# Patient Record
Sex: Female | Born: 1990 | Race: Black or African American | Hispanic: No | Marital: Married | Smoking: Never smoker
Health system: Southern US, Community
[De-identification: ages and names within clinical notes are randomized; demographics above are authoritative.]

## PROBLEM LIST (undated history)

## (undated) DIAGNOSIS — B1910 Unspecified viral hepatitis B without hepatic coma: Secondary | ICD-10-CM

## (undated) DIAGNOSIS — O24419 Gestational diabetes mellitus in pregnancy, unspecified control: Secondary | ICD-10-CM

## (undated) HISTORY — DX: Unspecified viral hepatitis B without hepatic coma: B19.10

## (undated) HISTORY — PX: NO PAST SURGERIES: SHX2092

---

## 2014-11-21 LAB — OB RESULTS CONSOLE RUBELLA ANTIBODY, IGM: RUBELLA: IMMUNE

## 2014-11-21 LAB — OB RESULTS CONSOLE ABO/RH: RH Type: POSITIVE

## 2014-11-21 LAB — OB RESULTS CONSOLE ANTIBODY SCREEN: Antibody Screen: NEGATIVE

## 2014-11-21 LAB — OB RESULTS CONSOLE HGB/HCT, BLOOD
HEMATOCRIT: 37 %
Hemoglobin: 12.3 g/dL

## 2014-11-21 LAB — OB RESULTS CONSOLE HEPATITIS B SURFACE ANTIGEN: Hepatitis B Surface Ag: POSITIVE

## 2014-11-21 LAB — OB RESULTS CONSOLE PLATELET COUNT: PLATELETS: 270 10*3/uL

## 2014-11-21 LAB — OB RESULTS CONSOLE RPR: RPR: NONREACTIVE

## 2014-11-27 LAB — OB RESULTS CONSOLE GC/CHLAMYDIA
Chlamydia: NEGATIVE
Gonorrhea: NEGATIVE

## 2015-11-22 LAB — OB RESULTS CONSOLE HGB/HCT, BLOOD
HEMATOCRIT: 37 %
Hemoglobin: 12.3 g/dL

## 2015-11-22 LAB — OB RESULTS CONSOLE GC/CHLAMYDIA: Gonorrhea: NEGATIVE

## 2015-11-22 LAB — OB RESULTS CONSOLE PLATELET COUNT: PLATELETS: 270 10*3/uL

## 2015-11-22 LAB — OB RESULTS CONSOLE RPR: RPR: NONREACTIVE

## 2015-11-28 LAB — DRUG SCREEN, URINE: Drug Screen, Urine: NEGATIVE

## 2015-11-28 LAB — OB RESULTS CONSOLE GC/CHLAMYDIA: Chlamydia: NEGATIVE

## 2015-12-02 LAB — HEPATITIS B DNA, ULTRAQUANTITATIVE, PCR: HEPATITIS B DNA: 588

## 2015-12-10 NOTE — L&D Delivery Note (Signed)
Delivery Note At 9:43 AM a viable and healthy vigorous female was delivered via Vaginal, Spontaneous Delivery (Presentation: Right Occiput Anterior).  APGAR: 9, 9; weight 9 lb 1.7 oz (4130 g).   Placenta status: Intact, Manual removal due to heavy bleeding immediately after delivery. Pitocin started <1 minute after delivery of baby. Bleeding decreased dramatically after manual removal and bimanual massage x 10 seconds. Fundus Firm. Cytotec 100 mcg given rectally. Cord: 3 vessels with the following complications: None.  Cord pH: NA.  Baby became apneic after 5 minute APGAR. NICU called. See note.   Anesthesia: Epidural,l Lidocaine  Episiotomy: None Lacerations: 2nd degree Suture Repair: 3.0 vicryl rapide Est. Blood Loss (mL): 800. Stage II PPH. Bleeding stable.   Mom to postpartum.  Baby to NICU. Mom's VSS, but will Type and cross two units of PRBCs and check Hgb due to low pre-delivery Hgb of 8.8.   Dorathy KinsmanSMITH, Kerah Hardebeck 05/24/2016, 10:36 AM

## 2015-12-22 LAB — AFP, QUAD SCREEN

## 2015-12-22 LAB — OB RESULTS CONSOLE HIV ANTIBODY (ROUTINE TESTING): HIV: NONREACTIVE

## 2016-03-13 ENCOUNTER — Encounter: Payer: Self-pay | Admitting: Advanced Practice Midwife

## 2016-03-13 ENCOUNTER — Ambulatory Visit (INDEPENDENT_AMBULATORY_CARE_PROVIDER_SITE_OTHER): Payer: Self-pay | Admitting: Advanced Practice Midwife

## 2016-03-13 VITALS — BP 97/63 | HR 86 | Temp 98.0°F | Ht 60.0 in | Wt 130.2 lb

## 2016-03-13 DIAGNOSIS — B181 Chronic viral hepatitis B without delta-agent: Secondary | ICD-10-CM | POA: Insufficient documentation

## 2016-03-13 DIAGNOSIS — Z603 Acculturation difficulty: Secondary | ICD-10-CM

## 2016-03-13 DIAGNOSIS — O280 Abnormal hematological finding on antenatal screening of mother: Secondary | ICD-10-CM | POA: Insufficient documentation

## 2016-03-13 DIAGNOSIS — B169 Acute hepatitis B without delta-agent and without hepatic coma: Secondary | ICD-10-CM

## 2016-03-13 DIAGNOSIS — O0993 Supervision of high risk pregnancy, unspecified, third trimester: Secondary | ICD-10-CM

## 2016-03-13 DIAGNOSIS — O285 Abnormal chromosomal and genetic finding on antenatal screening of mother: Secondary | ICD-10-CM

## 2016-03-13 DIAGNOSIS — O289 Unspecified abnormal findings on antenatal screening of mother: Secondary | ICD-10-CM

## 2016-03-13 LAB — CBC
HCT: 30.9 % — ABNORMAL LOW (ref 35.0–45.0)
Hemoglobin: 10 g/dL — ABNORMAL LOW (ref 11.7–15.5)
MCH: 27.7 pg (ref 27.0–33.0)
MCHC: 32.4 g/dL (ref 32.0–36.0)
MCV: 85.6 fL (ref 80.0–100.0)
MPV: 10.4 fL (ref 7.5–12.5)
Platelets: 202 10*3/uL (ref 140–400)
RBC: 3.61 MIL/uL — ABNORMAL LOW (ref 3.80–5.10)
RDW: 13.8 % (ref 11.0–15.0)
WBC: 7.3 10*3/uL (ref 3.8–10.8)

## 2016-03-13 LAB — POCT URINALYSIS DIP (DEVICE)
Bilirubin Urine: NEGATIVE
GLUCOSE, UA: NEGATIVE mg/dL
Hgb urine dipstick: NEGATIVE
Ketones, ur: NEGATIVE mg/dL
NITRITE: NEGATIVE
PH: 7 (ref 5.0–8.0)
PROTEIN: NEGATIVE mg/dL
Specific Gravity, Urine: 1.02 (ref 1.005–1.030)
UROBILINOGEN UA: 0.2 mg/dL (ref 0.0–1.0)

## 2016-03-13 MED ORDER — PANTOPRAZOLE SODIUM 20 MG PO TBEC
20.0000 mg | DELAYED_RELEASE_TABLET | Freq: Every day | ORAL | Status: DC
Start: 2016-03-13 — End: 2016-04-11

## 2016-03-13 NOTE — Progress Notes (Signed)
New OB Transfer Had prenatal care elsewhere See Smart Set  Subjective:    [name] is a G2P1001 6358w6d being seen today for her first obstetrical visit.  Her obstetrical history is significant for Hepatitis B, abnormal Quad Screen, Language barrier. Patient does intend to breast feed. Pregnancy history fully reviewed.  Patient reports no complaints. Except heartburn.  Filed Vitals:   03/13/16 0814 03/13/16 0821  BP: 97/63   Pulse: 86   Temp: 98 F (36.7 C)   Height:  5' (1.524 m)  Weight: 130 lb 3.2 oz (59.058 kg)     HISTORY: OB History  Gravida Para Term Preterm AB SAB TAB Ectopic Multiple Living  2 1 1       1     # Outcome Date GA Lbr Len/2nd Weight Sex Delivery Anes PTL Lv  2 Current           1 Term 06/20/04 672w0d  8 lb 5.3 oz (3.78 kg) M Vag-Spont   Y     Comments: vaccuum delivery, had + Hepatitis B blood test , [place]     Past Medical History  Diagnosis Date  . Hepatitis B infection     +test in first pregancy   History reviewed. No pertinent past surgical history. History reviewed. No pertinent family history.   Exam    Uterus:  Fundal Height: 28 cm  Pelvic Exam:    Perineum: Not examined due to done at other practice   Vulva: n/a   Vagina:  n/a   pH:    Cervix: n/a   Adnexa: not evaluated   Bony Pelvis: gynecoid  System: Breast:  normal appearance, no masses or tenderness   Skin: normal coloration and turgor, no rashes    Neurologic: oriented, grossly non-focal   Extremities: normal strength, tone, and muscle mass   HEENT neck supple with midline trachea   Mouth/Teeth mucous membranes moist, pharynx normal without lesions   Neck supple   Cardiovascular: regular rate and rhythm   Respiratory:  appears well, vitals normal, no respiratory distress, acyanotic, normal RR, ear and throat exam is normal, neck free of mass or lymphadenopathy, chest clear, no wheezing, crepitations, rhonchi, normal symmetric air entry   Abdomen: soft,  non-tender; bowel sounds normal; no masses,  no organomegaly   Urinary: n/a      Assessment:    Pregnancy: G2P1001 Patient Active Problem List   Diagnosis Date Noted  . Hepatitis B 03/13/2016  . Supervision of high risk pregnancy in third trimester 03/13/2016  . Language barrier, cultural differences 03/13/2016  . Abnormal quad screen 03/13/2016  . Multiple marker screen positive for Down syndrome 03/13/2016        Plan:     Initial labs drawn. Prenatal vitamins. Problem list reviewed and updated. Genetic Screening discussed Quad Screen: results reviewed.  Ultrasound discussed; fetal survey: ordered.  Follow up in 2 weeks. 50% of 30 min visit spent on counseling and coordination of care.   Used interpretor for visit Very difficult to communicate, even with interpretor Unclear if she had counseling for abnormal quad screen, will refer to MFM for US Will recheck Hep B titer today Rx Protonix sent    [facility],Keyon Winnick 03/13/2016

## 2016-03-13 NOTE — Progress Notes (Signed)
Here for first visit. Used [employer]  Interpreter#[id number].  Call disconnected. Used [employer] interpreter [id number]. Given prenatal education booklets.

## 2016-03-13 NOTE — Patient Instructions (Addendum)
Contraception Choices Contraception (birth control) is the use of any methods or devices to prevent pregnancy. Below are some methods to help avoid pregnancy. HORMONAL METHODS   Contraceptive implant. This is a thin, plastic tube containing progesterone hormone. It does not contain estrogen hormone. Your health care provider inserts the tube in the inner part of the upper arm. The tube can remain in place for up to 3 years. After 3 years, the implant must be removed. The implant prevents the ovaries from releasing an egg (ovulation), thickens the cervical mucus to prevent sperm from entering the uterus, and thins the lining of the inside of the uterus.  Progesterone-only injections. These injections are given every 3 months by your health care provider to prevent pregnancy. This synthetic progesterone hormone stops the ovaries from releasing eggs. It also thickens cervical mucus and changes the uterine lining. This makes it harder for sperm to survive in the uterus.  Birth control pills. These pills contain estrogen and progesterone hormone. They work by preventing the ovaries from releasing eggs (ovulation). They also cause the cervical mucus to thicken, preventing the sperm from entering the uterus. Birth control pills are prescribed by a health care provider.Birth control pills can also be used to treat heavy periods.  Minipill. This type of birth control pill contains only the progesterone hormone. They are taken every day of each month and must be prescribed by your health care provider.  Birth control patch. The patch contains hormones similar to those in birth control pills. It must be changed once a week and is prescribed by a health care provider.  Vaginal ring. The ring contains hormones similar to those in birth control pills. It is left in the vagina for 3 weeks, removed for 1 week, and then a new one is put back in place. The patient must be comfortable inserting and removing the ring  from the vagina.A health care provider's prescription is necessary.  Emergency contraception. Emergency contraceptives prevent pregnancy after unprotected sexual intercourse. This pill can be taken right after sex or up to 5 days after unprotected sex. It is most effective the sooner you take the pills after having sexual intercourse. Most emergency contraceptive pills are available without a prescription. Check with your pharmacist. Do not use emergency contraception as your only form of birth control. BARRIER METHODS   Female condom. This is a thin sheath (latex or rubber) that is worn over the penis during sexual intercourse. It can be used with spermicide to increase effectiveness.  Female condom. This is a soft, loose-fitting sheath that is put into the vagina before sexual intercourse.  Diaphragm. This is a soft, latex, dome-shaped barrier that must be fitted by a health care provider. It is inserted into the vagina, along with a spermicidal jelly. It is inserted before intercourse. The diaphragm should be left in the vagina for 6 to 8 hours after intercourse.  Cervical cap. This is a round, soft, latex or plastic cup that fits over the cervix and must be fitted by a health care provider. The cap can be left in place for up to 48 hours after intercourse.  Sponge. This is a soft, circular piece of polyurethane foam. The sponge has spermicide in it. It is inserted into the vagina after wetting it and before sexual intercourse.  Spermicides. These are chemicals that kill or block sperm from entering the cervix and uterus. They come in the form of creams, jellies, suppositories, foam, or tablets. They do not require a   prescription. They are inserted into the vagina with an applicator before having sexual intercourse. The process must be repeated every time you have sexual intercourse. INTRAUTERINE CONTRACEPTION  Intrauterine device (IUD). This is a T-shaped device that is put in a woman's uterus  during a menstrual period to prevent pregnancy. There are 2 types:  Copper IUD. This type of IUD is wrapped in copper wire and is placed inside the uterus. Copper makes the uterus and fallopian tubes produce a fluid that kills sperm. It can stay in place for 10 years.  Hormone IUD. This type of IUD contains the hormone progestin (synthetic progesterone). The hormone thickens the cervical mucus and prevents sperm from entering the uterus, and it also thins the uterine lining to prevent implantation of a fertilized egg. The hormone can weaken or kill the sperm that get into the uterus. It can stay in place for 3-5 years, depending on which type of IUD is used. PERMANENT METHODS OF CONTRACEPTION  Female tubal ligation. This is when the woman's fallopian tubes are surgically sealed, tied, or blocked to prevent the egg from traveling to the uterus.  Hysteroscopic sterilization. This involves placing a small coil or insert into each fallopian tube. Your doctor uses a technique called hysteroscopy to do the procedure. The device causes scar tissue to form. This results in permanent blockage of the fallopian tubes, so the sperm cannot fertilize the egg. It takes about 3 months after the procedure for the tubes to become blocked. You must use another form of birth control for these 3 months.  Female sterilization. This is when the female has the tubes that carry sperm tied off (vasectomy).This blocks sperm from entering the vagina during sexual intercourse. After the procedure, the man can still ejaculate fluid (semen). NATURAL PLANNING METHODS  Natural family planning. This is not having sexual intercourse or using a barrier method (condom, diaphragm, cervical cap) on days the woman could become pregnant.  Calendar method. This is keeping track of the length of each menstrual cycle and identifying when you are fertile.  Ovulation method. This is avoiding sexual intercourse during ovulation.  Symptothermal  method. This is avoiding sexual intercourse during ovulation, using a thermometer and ovulation symptoms.  Post-ovulation method. This is timing sexual intercourse after you have ovulated. Regardless of which type or method of contraception you choose, it is important that you use condoms to protect against the transmission of sexually transmitted infections (STIs). Talk with your health care provider about which form of contraception is most appropriate for you.   This information is not intended to replace advice given to you by your health care provider. Make sure you discuss any questions you have with your health care provider.   Document Released: 11/25/2005 Document Revised: 11/30/2013 Document Reviewed: 05/20/2013 Elsevier Interactive Patient Education [employer] Third Trimester of Pregnancy The third trimester is from week 29 through week 42, months 7 through 9. The third trimester is a time when the fetus is growing rapidly. At the end of the ninth month, the fetus is about 20 inches in length and weighs 6-10 pounds.  BODY CHANGES Your body goes through many changes during pregnancy. The changes vary from woman to woman.   Your weight will continue to increase. You can expect to gain 25-35 pounds (11-16 kg) by the end of the pregnancy.  You may begin to get stretch marks on your hips, abdomen, and breasts.  You may urinate more often because the fetus is moving lower  into your pelvis and pressing on your bladder.  You may develop or continue to have heartburn as a result of your pregnancy.  You may develop constipation because certain hormones are causing the muscles that push waste through your intestines to slow down.  You may develop hemorrhoids or swollen, bulging veins (varicose veins).  You may have pelvic pain because of the weight gain and pregnancy hormones relaxing your joints between the bones in your pelvis. Backaches may result from overexertion of the  muscles supporting your posture.  You may have changes in your hair. These can include thickening of your hair, rapid growth, and changes in texture. Some women also have hair loss during or after pregnancy, or hair that feels dry or thin. Your hair will most likely return to normal after your baby is born.  Your breasts will continue to grow and be tender. A yellow discharge may leak from your breasts called colostrum.  Your belly button may stick out.  You may feel short of breath because of your expanding uterus.  You may notice the fetus "dropping," or moving lower in your abdomen.  You may have a bloody mucus discharge. This usually occurs a few days to a week before labor begins.  Your cervix becomes thin and soft (effaced) near your due date. WHAT TO EXPECT AT YOUR PRENATAL EXAMS  You will have prenatal exams every 2 weeks until week 36. Then, you will have weekly prenatal exams. During a routine prenatal visit:  You will be weighed to make sure you and the fetus are growing normally.  Your blood pressure is taken.  Your abdomen will be measured to track your baby's growth.  The fetal heartbeat will be listened to.  Any test results from the previous visit will be discussed.  You may have a cervical check near your due date to see if you have effaced. At around 36 weeks, your caregiver will check your cervix. At the same time, your caregiver will also perform a test on the secretions of the vaginal tissue. This test is to determine if a type of bacteria, Group B streptococcus, is present. Your caregiver will explain this further. Your caregiver may ask you:  What your birth plan is.  How you are feeling.  If you are feeling the baby move.  If you have had any abnormal symptoms, such as leaking fluid, bleeding, severe headaches, or abdominal cramping.  If you are using any tobacco products, including cigarettes, chewing tobacco, and electronic cigarettes.  If you have  any questions. Other tests or screenings that may be performed during your third trimester include:  Blood tests that check for low iron levels (anemia).  Fetal testing to check the health, activity level, and growth of the fetus. Testing is done if you have certain medical conditions or if there are problems during the pregnancy.  HIV (human immunodeficiency virus) testing. If you are at high risk, you may be screened for HIV during your third trimester of pregnancy. FALSE LABOR You may feel small, irregular contractions that eventually go away. These are called Braxton Hicks contractions, or false labor. Contractions may last for hours, days, or even weeks before true labor sets in. If contractions come at regular intervals, intensify, or become painful, it is best to be seen by your caregiver.  SIGNS OF LABOR   Menstrual-like cramps.  Contractions that are 5 minutes apart or less.  Contractions that start on the top of the uterus and spread down to  the lower abdomen and back.  A sense of increased pelvic pressure or back pain.  A watery or bloody mucus discharge that comes from the vagina. If you have any of these signs before the 37th week of pregnancy, call your caregiver right away. You need to go to the hospital to get checked immediately. HOME CARE INSTRUCTIONS   Avoid all smoking, herbs, alcohol, and unprescribed drugs. These chemicals affect the formation and growth of the baby.  Do not use any tobacco products, including cigarettes, chewing tobacco, and electronic cigarettes. If you need help quitting, ask your health care provider. You may receive counseling support and other resources to help you quit.  Follow your caregiver's instructions regarding medicine use. There are medicines that are either safe or unsafe to take during pregnancy.  Exercise only as directed by your caregiver. Experiencing uterine cramps is a good sign to stop exercising.  Continue to eat regular,  healthy meals.  Wear a good support bra for breast tenderness.  Do not use hot tubs, steam rooms, or saunas.  Wear your seat belt at all times when driving.  Avoid raw meat, uncooked cheese, cat litter boxes, and soil used by cats. These carry germs that can cause birth defects in the baby.  Take your prenatal vitamins.  Take 1500-2000 mg of calcium daily starting at the 20th week of pregnancy until you deliver your baby.  Try taking a stool softener (if your caregiver approves) if you develop constipation. Eat more high-fiber foods, such as fresh vegetables or fruit and whole grains. Drink plenty of fluids to keep your urine clear or pale yellow.  Take warm sitz baths to soothe any pain or discomfort caused by hemorrhoids. Use hemorrhoid cream if your caregiver approves.  If you develop varicose veins, wear support hose. Elevate your feet for 15 minutes, 3-4 times a day. Limit salt in your diet.  Avoid heavy lifting, wear low heal shoes, and practice good posture.  Rest a lot with your legs elevated if you have leg cramps or low back pain.  Visit your dentist if you have not gone during your pregnancy. Use a soft toothbrush to brush your teeth and be gentle when you floss.  A sexual relationship may be continued unless your caregiver directs you otherwise.  Do not travel far distances unless it is absolutely necessary and only with the approval of your caregiver.  Take prenatal classes to understand, practice, and ask questions about the labor and delivery.  Make a trial run to the hospital.  Pack your hospital bag.  Prepare the baby's nursery.  Continue to go to all your prenatal visits as directed by your caregiver. SEEK MEDICAL CARE IF:  You are unsure if you are in labor or if your water has broken.  You have dizziness.  You have mild pelvic cramps, pelvic pressure, or nagging pain in your abdominal area.  You have persistent nausea, vomiting, or diarrhea.  You  have a bad smelling vaginal discharge.  You have pain with urination. SEEK IMMEDIATE MEDICAL CARE IF:   You have a fever.  You are leaking fluid from your vagina.  You have spotting or bleeding from your vagina.  You have severe abdominal cramping or pain.  You have rapid weight loss or gain.  You have shortness of breath with chest pain.  You notice sudden or extreme swelling of your face, hands, ankles, feet, or legs.  You have not felt your baby move in over an hour.  You have severe headaches that do not go away with medicine.  You have vision changes.   This information is not intended to replace advice given to you by your health care provider. Make sure you discuss any questions you have with your health care provider.   Document Released: 11/19/2001 Document Revised: 12/16/2014 Document Reviewed: 01/26/2013 Elsevier Interactive Patient Education 2016 [employer] Levonorgestrel intrauterine device (IUD) What is this medicine? LEVONORGESTREL IUD (LEE voe nor jes trel) is a contraceptive (birth control) device. The device is placed inside the uterus by a healthcare professional. It is used to prevent pregnancy and can also be used to treat heavy bleeding that occurs during your period. Depending on the device, it can be used for 3 to 5 years. This medicine may be used for other purposes; ask your health care provider or pharmacist if you have questions. What should I tell my health care provider before I take this medicine? They need to know if you have any of these conditions: -abnormal Pap smear -cancer of the breast, uterus, or cervix -diabetes -endometritis -genital or pelvic infection now or in the past -have more than one sexual partner or your partner has more than one partner -heart disease -history of an ectopic or tubal pregnancy -immune system problems -IUD in place -liver disease or tumor -problems with blood clots or take blood-thinners -use  intravenous drugs -uterus of unusual shape -vaginal bleeding that has not been explained -an unusual or allergic reaction to levonorgestrel, other hormones, silicone, or polyethylene, medicines, foods, dyes, or preservatives -pregnant or trying to get pregnant -breast-feeding How should I use this medicine? This device is placed inside the uterus by a health care professional. Talk to your pediatrician regarding the use of this medicine in children. Special care may be needed. Overdosage: If you think you have taken too much of this medicine contact a poison control center or emergency room at once. NOTE: This medicine is only for you. Do not share this medicine with others. What if I miss a dose? This does not apply. What may interact with this medicine? Do not take this medicine with any of the following medications: -amprenavir -bosentan -fosamprenavir This medicine may also interact with the following medications: -aprepitant -barbiturate medicines for inducing sleep or treating seizures -bexarotene -griseofulvin -medicines to treat seizures like carbamazepine, ethotoin, felbamate, oxcarbazepine, phenytoin, topiramate -modafinil -pioglitazone -rifabutin -rifampin -rifapentine -some medicines to treat HIV infection like atazanavir, indinavir, lopinavir, nelfinavir, tipranavir, ritonavir -St. John's wort -warfarin This list may not describe all possible interactions. Give your health care provider a list of all the medicines, herbs, non-prescription drugs, or dietary supplements you use. Also tell them if you smoke, drink alcohol, or use illegal drugs. Some items may interact with your medicine. What should I watch for while using this medicine? Visit your doctor or health care professional for regular check ups. See your doctor if you or your partner has sexual contact with others, becomes HIV positive, or gets a sexual transmitted disease. This product does not protect you  against HIV infection (AIDS) or other sexually transmitted diseases. You can check the placement of the IUD yourself by reaching up to the top of your vagina with clean fingers to feel the threads. Do not pull on the threads. It is a good habit to check placement after each menstrual period. Call your doctor right away if you feel more of the IUD than just the threads or if you cannot feel the threads at all. The  IUD may come out by itself. You may become pregnant if the device comes out. If you notice that the IUD has come out use a backup birth control method like condoms and call your health care provider. Using tampons will not change the position of the IUD and are okay to use during your period. What side effects may I notice from receiving this medicine? Side effects that you should report to your doctor or health care professional as soon as possible: -allergic reactions like skin rash, itching or hives, swelling of the face, lips, or tongue -fever, flu-like symptoms -genital sores -high blood pressure -no menstrual period for 6 weeks during use -pain, swelling, warmth in the leg -pelvic pain or tenderness -severe or sudden headache -signs of pregnancy -stomach cramping -sudden shortness of breath -trouble with balance, talking, or walking -unusual vaginal bleeding, discharge -yellowing of the eyes or skin Side effects that usually do not require medical attention (report to your doctor or health care professional if they continue or are bothersome): -acne -breast pain -change in sex drive or performance -changes in weight -cramping, dizziness, or faintness while the device is being inserted -headache -irregular menstrual bleeding within first 3 to 6 months of use -nausea This list may not describe all possible side effects. Call your doctor for medical advice about side effects. You may report side effects to FDA at 1-800-FDA-1088. Where should I keep my medicine? This does  not apply. NOTE: This sheet is a summary. It may not cover all possible information. If you have questions about this medicine, talk to your doctor, pharmacist, or health care provider.    2016, Elsevier/[employer] Standard. (2011-12-26 13:54:04)

## 2016-03-14 LAB — HEPATITIS B DNA, ULTRAQUANTITATIVE, PCR
Hepatitis B DNA (Calc): 2.33 Log IU/mL — ABNORMAL HIGH (ref <1.30)
Hepatitis B DNA: 216 IU/mL — ABNORMAL HIGH (ref <20)

## 2016-03-14 LAB — GLUCOSE TOLERANCE, 1 HOUR (50G) W/O FASTING: GLUCOSE, 1 HR, GESTATIONAL: 136 mg/dL (ref <140)

## 2016-03-14 LAB — HIV ANTIBODY (ROUTINE TESTING W REFLEX): HIV: NONREACTIVE

## 2016-03-14 LAB — RPR

## 2016-03-16 ENCOUNTER — Encounter: Payer: Self-pay | Admitting: *Deleted

## 2016-03-16 ENCOUNTER — Encounter: Payer: Self-pay | Admitting: Advanced Practice Midwife

## 2016-03-16 DIAGNOSIS — O24419 Gestational diabetes mellitus in pregnancy, unspecified control: Secondary | ICD-10-CM | POA: Insufficient documentation

## 2016-03-18 ENCOUNTER — Telehealth: Payer: Self-pay | Admitting: General Practice

## 2016-03-18 DIAGNOSIS — O280 Abnormal hematological finding on antenatal screening of mother: Secondary | ICD-10-CM

## 2016-03-18 NOTE — Telephone Encounter (Addendum)
[facility]. Patient's 1 hr gtt was elevated and needs 3 hr. Placed referral to MFM. MFM will call back to set up genetic counseling as the order needs an addendum from MFM

## 2016-03-20 NOTE — Telephone Encounter (Signed)
Called patient with interpreter [phone] and the phone number was invalid. Will discuss results & need for appt with patient at next appt. Made note in appt info.

## 2016-03-22 ENCOUNTER — Ambulatory Visit (HOSPITAL_COMMUNITY): Payer: Self-pay

## 2016-03-27 ENCOUNTER — Encounter: Payer: Self-pay | Admitting: Advanced Practice Midwife

## 2016-03-28 ENCOUNTER — Ambulatory Visit (INDEPENDENT_AMBULATORY_CARE_PROVIDER_SITE_OTHER): Payer: Self-pay | Admitting: Obstetrics & Gynecology

## 2016-03-28 VITALS — BP 106/67 | HR 95 | Wt 131.0 lb

## 2016-03-28 DIAGNOSIS — Z3493 Encounter for supervision of normal pregnancy, unspecified, third trimester: Secondary | ICD-10-CM

## 2016-03-28 DIAGNOSIS — O99810 Abnormal glucose complicating pregnancy: Secondary | ICD-10-CM

## 2016-03-28 DIAGNOSIS — K219 Gastro-esophageal reflux disease without esophagitis: Secondary | ICD-10-CM

## 2016-03-28 DIAGNOSIS — O219 Vomiting of pregnancy, unspecified: Secondary | ICD-10-CM

## 2016-03-28 DIAGNOSIS — R8290 Unspecified abnormal findings in urine: Secondary | ICD-10-CM

## 2016-03-28 DIAGNOSIS — Z3483 Encounter for supervision of other normal pregnancy, third trimester: Secondary | ICD-10-CM

## 2016-03-28 DIAGNOSIS — O99613 Diseases of the digestive system complicating pregnancy, third trimester: Secondary | ICD-10-CM

## 2016-03-28 LAB — POCT URINALYSIS DIP (DEVICE)
BILIRUBIN URINE: NEGATIVE
Glucose, UA: NEGATIVE mg/dL
HGB URINE DIPSTICK: NEGATIVE
KETONES UR: NEGATIVE mg/dL
NITRITE: POSITIVE — AB
PH: 6 (ref 5.0–8.0)
PROTEIN: NEGATIVE mg/dL
Specific Gravity, Urine: 1.015 (ref 1.005–1.030)
Urobilinogen, UA: 0.2 mg/dL (ref 0.0–1.0)

## 2016-03-28 MED ORDER — FAMOTIDINE 20 MG PO TABS: 20.0000 mg | ORAL_TABLET | Freq: Two times a day (BID) | ORAL | Status: DC

## 2016-03-28 MED ORDER — NITROFURANTOIN MONOHYD MACRO 100 MG PO CAPS: 100.0000 mg | ORAL_CAPSULE | Freq: Two times a day (BID) | ORAL | Status: DC

## 2016-03-28 MED ORDER — METOCLOPRAMIDE HCL 10 MG PO TABS: 10.0000 mg | ORAL_TABLET | Freq: Four times a day (QID) | ORAL | Status: DC | PRN

## 2016-03-28 NOTE — Progress Notes (Signed)
  Subjective:  [name] is a 25 y.o. G2P1001 at 3264w0d being seen today for ongoing prenatal care.  She is currently monitored for the following issues for this low-risk pregnancy and has Hepatitis B; Supervision of normal pregnancy in third trimester; Language barrier, cultural differences; Abnormal quad screen; and Abnormal maternal glucose tolerance, antepartum on her problem list.  Amharic interpreter used  Patient reports nausea/vomiting past 2 days following meals; patient reports tingling in her legs at night; also reports pelvic pain. Also reports frequent urination.   Contractions: Not present. Vag. Bleeding: None.  Movement: Present. Denies leaking of fluid.   The following portions of the patient's history were reviewed and updated as appropriate: allergies, current medications, past family history, past medical history, past social history, past surgical history and problem list. Problem list updated.  Objective:   Filed Vitals:   03/28/16 0914  BP: 106/67  Pulse: 95  Weight: 131 lb (59.421 kg)    Fetal Status: Fetal Heart Rate (bpm): 137 Fundal Height: 31 cm Movement: Present     General:  Alert, oriented and cooperative. Patient is in no acute distress.  Skin: Skin is warm and dry. No rash noted.   Cardiovascular: Normal heart rate noted  Respiratory: Normal respiratory effort, no problems with respiration noted  Abdomen: Soft, gravid, appropriate for gestational age. Pain/Pressure: Present     Pelvic: Vag. Bleeding: None Vag D/C Character: White  Cervical exam deferred        Extremities: Normal range of motion.  Edema: None  Mental Status: Normal mood and affect. Normal behavior. Normal judgment and thought content.   Urinalysis: Urine Protein: Negative Urine Glucose: Negative.  Positive nitrites and mod leuks on UA. Assessment and Plan:  Pregnancy: G2P1001 at 6964w0d  1. Abnormal urinalysis Positive nitrites and mod leuks on UA. Likely UTI. Macrobid  presumptively ordered. Will follow up cultures - Culture, OB Urine - nitrofurantoin, macrocrystal-monohydrate, (MACROBID) 100 MG capsule; Take 1 capsule (100 mg total) by mouth 2 (two) times daily.  Dispense: 14 capsule; Refill: 1  2. Gastroesophageal reflux during pregnancy in third trimester, antepartum - famotidine (PEPCID) 20 MG tablet; Take 1 tablet (20 mg total) by mouth 2 (two) times daily.  Dispense: 60 tablet; Refill: 3  3. Nausea and vomiting during pregnancy - metoCLOPramide (REGLAN) 10 MG tablet; Take 1 tablet (10 mg total) by mouth 4 (four) times daily as needed for nausea or vomiting.  Dispense: 30 tablet; Refill: 2  4. Supervision of normal pregnancy in third trimester 3 hr GTT today for 1 hr GTT of 136; will follow up results and manage accordingly.  Preterm labor symptoms and general obstetric precautions including but not limited to vaginal bleeding, contractions, leaking of fluid and fetal movement were reviewed in detail with the patient. Please refer to After Visit Summary for other counseling recommendations.  Return in about 2 weeks (around 04/11/2016) for OB Visit.   Tereso NewcomerUgonna A Caliber Landess, MD

## 2016-03-28 NOTE — Patient Instructions (Signed)
Return to clinic for any scheduled appointments or obstetric concerns, or go to [facility] for evaluation   AREA PEDIATRIC/FAMILY PRACTICE PHYSICIANS  [facility] [address] [place], [place] [address] Phone - [phone]   Fax - [phone]  [facility] [address] [place], [place]  [address] Phone - [phone]   Fax - [phone]  [facility] [address] [place], [place]  [address] Phone - [phone]   Fax - [phone]  [facility] [address] [place], [place]  [address] Phone - [phone]   Fax - [phone]  [facility] [address] [place], [place]  [address] Phone - [phone]   Fax - [phone]  [facility] [address] [place], [place]  [address] Phone - [phone]   Fax - [phone]  [facility] [address] [place], [place]  [address] Phone - [phone]   Fax - [phone]  [facility] [address] [place], [place]  [address] Phone - [phone]   Fax - [phone]  [facility] [address] [place], [place]  [address] Phone - [phone]   Fax - [phone] [facility] [address] [place], [place]  [address] Phone - [phone]   Fax - [phone]  [facility] [address] [place], [place]  [address] Phone - [phone]   Fax - [phone]  [facility] [address] [place], [place]  [address] Phone - [phone]   Fax - [phone]  [facility] [address] [place], [place]  [address] Phone - [phone]   Fax - [phone]  [facility] GOSR[facility] [address] [place], [place]  [address] Phone - [phone]  [facility] [address] [place], [place]  [address] Phone - [phone]   Fax - [phone]  [facility] [address] [place], [place]  [address] Phone - [phone]   Fax - [phone]  [facility] [address] [place], [place]  [address] Phone - [phone]   Fax - [phone]  [facility] [address] [place], [place]  [address] Phone - [phone]   Fax - [phone]   [facility] [address] [place], [place]  [address] Phone - [phone]   Fax - [phone]  [facility] [address] [place], [place]  [address] Phone - [phone]   Fax - [phone]  [facility] [address] [place], [place]  [address] Phone - [phone]   Fax - [phone]  [facility] [address] , [place]  [address] Phone - [phone]   Fax - [phone]  [facility] [address] , [place]  [address] Phone - [phone]   Fax - [phone] [facility] [address] [place], [place]  [address] Phone - [phone]   Fax - [phone]  [facility] [address] [place], [place]  [address] Phone - [phone]   Fax - [phone]  [facility] [address] [place],   [address] Phone - [phone]   Fax - [phone]    

## 2016-03-29 ENCOUNTER — Encounter: Payer: Self-pay | Admitting: Obstetrics & Gynecology

## 2016-03-29 LAB — GLUCOSE TOLERANCE, 3 HOURS
GLUCOSE, 1 HOUR-GESTATIONAL: 200 mg/dL — AB (ref <190)
GLUCOSE, 2 HOUR-GESTATIONAL: 166 mg/dL — AB (ref <165)
Glucose Tolerance, Fasting: 94 mg/dL (ref 65–104)
Glucose, GTT - 3 Hour: 147 mg/dL — ABNORMAL HIGH (ref <145)

## 2016-03-29 LAB — CULTURE, OB URINE
Colony Count: NO GROWTH
Organism ID, Bacteria: NO GROWTH

## 2016-04-10 ENCOUNTER — Telehealth: Payer: Self-pay | Admitting: *Deleted

## 2016-04-10 NOTE — Telephone Encounter (Signed)
Per message from Dr. Macon LargeAnyanwu need to tell patient she has GDM and needs GDM education .  Attempted to call [place] with [employer] [phone] but patient does not have number-' Will make a note on patient appointment to discuss with her- she  is being seen in the am for obfu

## 2016-04-11 ENCOUNTER — Ambulatory Visit (INDEPENDENT_AMBULATORY_CARE_PROVIDER_SITE_OTHER): Payer: Self-pay | Admitting: Obstetrics & Gynecology

## 2016-04-11 VITALS — BP 103/65 | HR 78 | Wt 140.3 lb

## 2016-04-11 DIAGNOSIS — O0993 Supervision of high risk pregnancy, unspecified, third trimester: Secondary | ICD-10-CM

## 2016-04-11 LAB — POCT URINALYSIS DIP (DEVICE)
Bilirubin Urine: NEGATIVE
GLUCOSE, UA: NEGATIVE mg/dL
Hgb urine dipstick: NEGATIVE
KETONES UR: NEGATIVE mg/dL
NITRITE: NEGATIVE
PH: 6.5 (ref 5.0–8.0)
PROTEIN: NEGATIVE mg/dL
Specific Gravity, Urine: 1.01 (ref 1.005–1.030)
UROBILINOGEN UA: 0.2 mg/dL (ref 0.0–1.0)

## 2016-04-11 MED ORDER — PANTOPRAZOLE SODIUM 40 MG PO TBEC: 40.0000 mg | DELAYED_RELEASE_TABLET | Freq: Every day | ORAL | Status: DC

## 2016-04-11 NOTE — Telephone Encounter (Signed)
Patient did come to ob appt and was told she has GDM- made appt for 04/15/16 to meet with diabetic educator.

## 2016-04-11 NOTE — Progress Notes (Signed)
Nutrition Note: GDM diet education Pt has gained  17.3# @ 6560w0d, which is < expected. Pt reports eating 3 meals and 3 snacks daily. Pt is taking PNV. Pt reports no N & V or heartburn. NKFA. Pt received verbal and written education on GDM diet. Discussed weight gain goals of 25-35 # Pt plans to BF Pt does not have WIC but plans to apply.  F/u in 3-4 weeks. Carloyn Mannerebekah Fredrick [place] MS RD LDN

## 2016-04-11 NOTE — Progress Notes (Signed)
Subjective:  [name] is a 25 y.o. G2P1001 at 3744w0d being seen today for ongoing prenatal care.  She is currently monitored for the following issues for this high-risk pregnancy and has Hepatitis B; Supervision of high risk pregnancy in third trimester; Language barrier, cultural differences; Abnormal quad screen; and Gestational diabetes mellitus, antepartum on her problem list.  Patient reports GERD in spite of medical management with famotidine.  Contractions: Not present. Vag. Bleeding: None.  Movement: Present. Denies leaking of fluid.   The following portions of the patient's history were reviewed and updated as appropriate: allergies, current medications, past family history, past medical history, past social history, past surgical history and problem list. Problem list updated.  Objective:   Filed Vitals:   04/11/16 1027 04/11/16 1055  BP: 103/65 103/65  Pulse: 78 78  Weight: 140 lb 4.8 oz (63.64 kg)     Fetal Status: Fetal Heart Rate (bpm): 159   Movement: Present     General:  Alert, oriented and cooperative. Patient is in no acute distress.  Skin: Skin is warm and dry. No rash noted.   Cardiovascular: Normal heart rate noted  Respiratory: Normal respiratory effort, no problems with respiration noted  Abdomen: Soft, gravid, appropriate for gestational age. Pain/Pressure: Present     Pelvic: Vag. Bleeding: None Vag D/C Character: White   Cervical exam deferred        Extremities: Normal range of motion.  Edema: None  Mental Status: Normal mood and affect. Normal behavior. Normal judgment and thought content.   Urinalysis: Urine Protein: Negative Urine Glucose: Negative  Assessment and Plan:  Pregnancy: G2P1001 at 5844w0d  1. Supervision of high risk pregnancy in third trimester Today she was diagnosed with GDM but was unable to meet with the diabetes counselor regarding blood sugar checks. Will plan for her to return on Monday 5/8 for GDM education.   Patient  complains of continued GERD in spite of prior medical management with famotidine, also complains that medication makes her more fatigued. Will prescribe Protonix for daily use.  - pantoprazole (PROTONIX) 40 MG tablet; Take 1 tablet (40 mg total) by mouth daily.  Dispense: 30 tablet; Refill: 3  Preterm labor symptoms and general obstetric precautions including but not limited to vaginal bleeding, contractions, leaking of fluid and fetal movement were reviewed in detail with the patient. Please refer to After Visit Summary for other counseling recommendations.  Return in about 4 days (around 04/15/2016).   Hessie Knowshomas C Chalese Peach, Med Student

## 2016-04-11 NOTE — Patient Instructions (Signed)
Gestational Diabetes Mellitus  Gestational diabetes mellitus, often simply referred to as gestational diabetes, is a type of diabetes that some women develop during pregnancy. In gestational diabetes, the pancreas does not make enough insulin (a hormone), the cells are less responsive to the insulin that is made (insulin resistance), or both. Normally, insulin moves sugars from food into the tissue cells. The tissue cells use the sugars for energy. The lack of insulin or the lack of normal response to insulin causes excess sugars to build up in the blood instead of going into the tissue cells. As a result, high blood sugar (hyperglycemia) develops. The effect of high sugar (glucose) levels can cause many problems.   RISK FACTORS  You have an increased chance of developing gestational diabetes if you have a family history of diabetes and also have one or more of the following risk factors:  · A body mass index over 30 (obesity).  · A previous pregnancy with gestational diabetes.  · An older age at the time of pregnancy.  If blood glucose levels are kept in the normal range during pregnancy, women can have a healthy pregnancy. If your blood glucose levels are not well controlled, there may be risks to you, your unborn baby (fetus), your labor and delivery, or your newborn baby.   SYMPTOMS   If symptoms are experienced, they are much like symptoms you would normally expect during pregnancy. The symptoms of gestational diabetes include:   · Increased thirst (polydipsia).  · Increased urination (polyuria).  · Increased urination during the night (nocturia).  · Weight loss. This weight loss may be rapid.  · Frequent, recurring infections.  · Tiredness (fatigue).  · Weakness.  · Vision changes, such as blurred vision.  · Fruity smell to your breath.  · Abdominal pain.  DIAGNOSIS  Diabetes is diagnosed when blood glucose levels are increased. Your blood glucose level may be checked by one or more of the following blood  tests:  · A fasting blood glucose test. You will not be allowed to eat for at least 8 hours before a blood sample is taken.  · A random blood glucose test. Your blood glucose is checked at any time of the day regardless of when you ate.  · An oral glucose tolerance test (OGTT). Your blood glucose is measured after you have not eaten (fasted) for 1-3 hours and then after you drink a glucose-containing beverage. Since the hormones that cause insulin resistance are highest at about 24-28 weeks of a pregnancy, an OGTT is usually performed during that time. If you have risk factors, you may be screened for undiagnosed type 2 diabetes at your first prenatal visit.  TREATMENT   Gestational diabetes should be managed first with diet and exercise. Medicines may be added only if they are needed.  · You will need to take diabetes medicine or insulin daily to keep blood glucose levels in the desired range.  · You will need to match insulin dosing with exercise and healthy food choices.  If you have gestational diabetes, your treatment goal is to maintain the following blood glucose levels:  · Before meals (preprandial): at or below 95 mg/dL.  · After meals (postprandial):    One hour after a meal: at or below 140 mg/dL.    Two hours after a meal: at or below 120 mg/dL.  If you have pre-existing type 1 or type 2 diabetes, your treatment goal is to maintain the following blood glucose levels:  · Before   meals, at bedtime, and overnight: 60-99 mg/dL.  · After meals: peak of 100-129 mg/dL.  HOME CARE INSTRUCTIONS   · Have your hemoglobin A1c level checked twice a year.  · Perform daily blood glucose monitoring as directed by your health care provider. It is common to perform frequent blood glucose monitoring.  · Monitor urine ketones when you are ill and as directed by your health care provider.  · Take your diabetes medicine and insulin as directed by your health care provider to maintain your blood glucose level in the desired  range.  ¨ Never run out of diabetes medicine or insulin. It is needed every day.  ¨ Adjust insulin based on your intake of carbohydrates. Carbohydrates can raise blood glucose levels but need to be included in your diet. Carbohydrates provide vitamins, minerals, and fiber which are an essential part of a healthy diet. Carbohydrates are found in fruits, vegetables, whole grains, dairy products, legumes, and foods containing added sugars.  · Eat healthy foods. Alternate 3 meals with 3 snacks.  · Maintain a healthy weight gain. The usual total expected weight gain varies according to your prepregnancy body mass index (BMI).  · Carry a medical alert card or wear your medical alert jewelry.  · Carry a 15-gram carbohydrate snack with you at all times to treat low blood glucose (hypoglycemia). Some examples of 15-gram carbohydrate snacks include:  ¨ Glucose tablets, 3 or 4.  ¨ Glucose gel, 15-gram tube.  ¨ Raisins, 2 tablespoons (24 g).  ¨ Jelly beans, 6.  ¨ Animal crackers, 8.  ¨ Fruit juice, regular soda, or low-fat milk, 4 ounces (120 mL).  ¨ Gummy treats, 9.  · Recognize hypoglycemia. Hypoglycemia during pregnancy occurs with blood glucose levels of 60 mg/dL and below. The risk for hypoglycemia increases when fasting or skipping meals, during or after intense exercise, and during sleep. Hypoglycemia symptoms can include:  ¨ Tremors or shakes.  ¨ Decreased ability to concentrate.  ¨ Sweating.  ¨ Increased heart rate.  ¨ Headache.  ¨ Dry mouth.  ¨ Hunger.  ¨ Irritability.  ¨ Anxiety.  ¨ Restless sleep.  ¨ Altered speech or coordination.  ¨ Confusion.  · Treat hypoglycemia promptly. If you are alert and able to safely swallow, follow the 15:15 rule:  ¨ Take 15-20 grams of rapid-acting glucose or carbohydrate. Rapid-acting options include glucose gel, glucose tablets, or 4 ounces (120 mL) of fruit juice, regular soda, or low-fat milk.  ¨ Check your blood glucose level 15 minutes after taking the glucose.  ¨ Take 15-20  grams more of glucose if the repeat blood glucose level is still 70 mg/dL or below.  ¨ Eat a meal or snack within 1 hour once blood glucose levels return to normal.  · Be alert to polyuria (excess urination) and polydipsia (excess thirst) which are early signs of hyperglycemia. An early awareness of hyperglycemia allows for prompt treatment. Treat hyperglycemia as directed by your health care provider.  · Engage in at least 30 minutes of physical activity a day or as directed by your health care provider. Ten minutes of physical activity timed 30 minutes after each meal is encouraged to control postprandial blood glucose levels.  · Adjust your insulin dosing and food intake as needed if you start a new exercise or sport.  · Follow your sick-day plan at any time you are unable to eat or drink as usual.  · Avoid tobacco and alcohol use.  · Keep all follow-up visits as directed   by your health care provider.  · Follow the advice of your health care provider regarding your prenatal and post-delivery (postpartum) appointments, meal planning, exercise, medicines, vitamins, blood tests, other medical tests, and physical activities.  · Perform daily skin and foot care. Examine your skin and feet daily for cuts, bruises, redness, nail problems, bleeding, blisters, or sores.  · Brush your teeth and gums at least twice a day and floss at least once a day. Follow up with your dentist regularly.  · Schedule an eye exam during the first trimester of your pregnancy or as directed by your health care provider.  · Share your diabetes management plan with your workplace or school.  · Stay up-to-date with immunizations.  · Learn to manage stress.  · Obtain ongoing diabetes education and support as needed.  · Learn about and consider breastfeeding your baby.  · You should have your blood sugar level checked 6-12 weeks after delivery. This is done with an oral glucose tolerance test (OGTT).  SEEK MEDICAL CARE IF:   · You are unable to  eat food or drink fluids for more than 6 hours.  · You have nausea and vomiting for more than 6 hours.  · You have a blood glucose level of 200 mg/dL and you have ketones in your urine.  · There is a change in mental status.  · You develop vision problems.  · You have a persistent headache.  · You have upper abdominal pain or discomfort.  · You develop an additional serious illness.  · You have diarrhea for more than 6 hours.  · You have been sick or have had a fever for a couple of days and are not getting better.  SEEK IMMEDIATE MEDICAL CARE IF:   · You have difficulty breathing.  · You no longer feel the baby moving.  · You are bleeding or have discharge from your vagina.  · You start having premature contractions or labor.  MAKE SURE YOU:  · Understand these instructions.  · Will watch your condition.  · Will get help right away if you are not doing well or get worse.     This information is not intended to replace advice given to you by your health care provider. Make sure you discuss any questions you have with your health care provider.     Document Released: 03/03/2001 Document Revised: 12/16/2014 Document Reviewed: 06/23/2012  Elsevier Interactive Patient Education ©2016 Elsevier Inc.

## 2016-04-11 NOTE — Progress Notes (Signed)
Pt reports having headaches  Video Interpreter [phone]

## 2016-04-15 ENCOUNTER — Ambulatory Visit: Payer: Self-pay | Admitting: *Deleted

## 2016-04-15 ENCOUNTER — Encounter: Payer: Medicaid Other | Attending: Obstetrics & Gynecology | Admitting: Dietician

## 2016-04-15 ENCOUNTER — Encounter: Payer: Self-pay | Admitting: Obstetrics and Gynecology

## 2016-04-15 DIAGNOSIS — Z029 Encounter for administrative examinations, unspecified: Secondary | ICD-10-CM | POA: Insufficient documentation

## 2016-04-15 DIAGNOSIS — O24419 Gestational diabetes mellitus in pregnancy, unspecified control: Secondary | ICD-10-CM

## 2016-04-15 MED ORDER — PRENATAL VITAMINS 0.8 MG PO TABS: 1.0000 | ORAL_TABLET | Freq: Every day | ORAL | Status: DC

## 2016-04-15 MED ORDER — GLUCOSE BLOOD VI STRP: ORAL_STRIP | Status: DC

## 2016-04-15 MED ORDER — ACCU-CHEK FASTCLIX LANCETS MISC: 1.0000 | Freq: Four times a day (QID) | Status: DC

## 2016-04-15 MED ORDER — ACCU-CHEK NANO SMARTVIEW W/DEVICE KIT: 1.0000 | PACK | Freq: Once | Status: DC

## 2016-04-15 NOTE — Progress Notes (Signed)
Nutrition note: GDM diet education Pt was recently diagnosed with GDM. Pt has gained 17.3# @ 6957w4d, which is slightly < recommended.  Pt reports eating 3 meals & 2-3 snacks/d. Pt is not taking a PNV because she does not have a Rx (asked nurse to put one in computer). Pt reports no N&V but has some heartburn. NKFA. Pt reports walking most days but is unsure of how long. Pt received verbal & written education via video interpreter about GDM diet. Encouraged ~30 mins of walking most days. Discussed wt gain goals of 25-35# or 1#/wk. Pt agrees to follow GDM diet with 3 meals & 1-3 snacks/d with proper CHO/ protein combination. Pt has WIC & plans to BF. F/u in 2-4 wks Blondell RevealLaura Creola Krotz, MS, RD, LDN, [facility]

## 2016-04-15 NOTE — Progress Notes (Signed)
04/15/16: GDM Education Recently Dx with GDM. G3 P2.  Youngest child is 22 months. Speaks on [place].  [employer] use for the interview.. Has Medicaid insurance and uses the [facility]. Nursing is to call in a prescription for the Accu-Chek Aviva Plus meter, strips and lancets. Instructed using a demo meter.  She had some appropriate questions. Instructed to monitor BGM 4 times per day (fasting and 2 hour post meal). Instructed to bring meter and blood glucose log to all clinic appointments. Instructed to walk 30 minutes 5-7 times per week in the cooler part of the day. Reviewed post partum self care. To F/Y with me in clinic on Monday's as needed.

## 2016-04-22 ENCOUNTER — Encounter: Payer: Self-pay | Admitting: Obstetrics and Gynecology

## 2016-04-22 ENCOUNTER — Ambulatory Visit (INDEPENDENT_AMBULATORY_CARE_PROVIDER_SITE_OTHER): Payer: Self-pay | Admitting: Obstetrics and Gynecology

## 2016-04-22 VITALS — BP 120/92 | HR 72 | Wt 141.6 lb

## 2016-04-22 DIAGNOSIS — O24419 Gestational diabetes mellitus in pregnancy, unspecified control: Secondary | ICD-10-CM

## 2016-04-22 DIAGNOSIS — O0993 Supervision of high risk pregnancy, unspecified, third trimester: Secondary | ICD-10-CM

## 2016-04-22 DIAGNOSIS — O280 Abnormal hematological finding on antenatal screening of mother: Secondary | ICD-10-CM

## 2016-04-22 DIAGNOSIS — O289 Unspecified abnormal findings on antenatal screening of mother: Secondary | ICD-10-CM

## 2016-04-22 DIAGNOSIS — Z603 Acculturation difficulty: Secondary | ICD-10-CM

## 2016-04-22 LAB — POCT URINALYSIS DIP (DEVICE)
Bilirubin Urine: NEGATIVE
GLUCOSE, UA: NEGATIVE mg/dL
Hgb urine dipstick: NEGATIVE
Ketones, ur: NEGATIVE mg/dL
NITRITE: NEGATIVE
PH: 5.5 (ref 5.0–8.0)
PROTEIN: NEGATIVE mg/dL
Specific Gravity, Urine: <=1.005 (ref 1.005–1.030)
UROBILINOGEN UA: 0.2 mg/dL (ref 0.0–1.0)

## 2016-04-22 MED ORDER — ACCU-CHEK NANO SMARTVIEW W/DEVICE KIT: 1.0000 | PACK | Freq: Once | Status: DC

## 2016-04-22 NOTE — Progress Notes (Signed)
Subjective:  [name] is a 25 y.o. G2P1001 at 5910w4d being seen today for ongoing prenatal care.  She is currently monitored for the following issues for this high-risk pregnancy and has Hepatitis B; Supervision of high risk pregnancy in third trimester; Language barrier, cultural differences; Abnormal quad screen; and Gestational diabetes mellitus, antepartum on her problem list.  Patient reports no complaints.  Contractions: Not present. Vag. Bleeding: None.  Movement: Present. Denies leaking of fluid.   The following portions of the patient's history were reviewed and updated as appropriate: allergies, current medications, past family history, past medical history, past social history, past surgical history and problem list. Problem list updated.  Objective:   Filed Vitals:   04/22/16 0905  BP: 120/92  Pulse: 72  Weight: 141 lb 9.6 oz (64.229 kg)    Fetal Status: Fetal Heart Rate (bpm): 142   Movement: Present     General:  Alert, oriented and cooperative. Patient is in no acute distress.  Skin: Skin is warm and dry. No rash noted.   Cardiovascular: Normal heart rate noted  Respiratory: Normal respiratory effort, no problems with respiration noted  Abdomen: Soft, gravid, appropriate for gestational age. Pain/Pressure: Present     Pelvic: Vag. Bleeding: None     Cervical exam deferred        Extremities: Normal range of motion.     Mental Status: Normal mood and affect. Normal behavior. Normal judgment and thought content.   Urinalysis: Urine Protein: Negative Urine Glucose: Negative  Assessment and Plan:  Pregnancy: G2P1001 at 5110w4d  1. Gestational diabetes mellitus, antepartum Patient has had a malfunctioning meter. Reading is always 3.7. New meter ordered Patient reports adhering to the diet  2. Abnormal quad screen   3. Language barrier, cultural differences   4. Supervision of high risk pregnancy in third trimester Continue current care  Preterm labor  symptoms and general obstetric precautions including but not limited to vaginal bleeding, contractions, leaking of fluid and fetal movement were reviewed in detail with the patient. Please refer to After Visit Summary for other counseling recommendations.  Return in about 1 week (around 04/29/2016).   Catalina AntiguaPeggy Caidynce Muzyka, MD

## 2016-04-29 ENCOUNTER — Ambulatory Visit (INDEPENDENT_AMBULATORY_CARE_PROVIDER_SITE_OTHER): Payer: Medicaid Other | Admitting: Obstetrics and Gynecology

## 2016-04-29 ENCOUNTER — Encounter: Payer: Medicaid Other | Attending: Obstetrics & Gynecology | Admitting: Dietician

## 2016-04-29 VITALS — BP 114/64 | HR 86 | Wt 142.8 lb

## 2016-04-29 DIAGNOSIS — Z113 Encounter for screening for infections with a predominantly sexual mode of transmission: Secondary | ICD-10-CM | POA: Diagnosis not present

## 2016-04-29 DIAGNOSIS — B1910 Unspecified viral hepatitis B without hepatic coma: Secondary | ICD-10-CM | POA: Diagnosis not present

## 2016-04-29 DIAGNOSIS — O24419 Gestational diabetes mellitus in pregnancy, unspecified control: Secondary | ICD-10-CM

## 2016-04-29 DIAGNOSIS — O98413 Viral hepatitis complicating pregnancy, third trimester: Secondary | ICD-10-CM | POA: Diagnosis not present

## 2016-04-29 DIAGNOSIS — Z1151 Encounter for screening for human papillomavirus (HPV): Secondary | ICD-10-CM | POA: Diagnosis not present

## 2016-04-29 DIAGNOSIS — O98419 Viral hepatitis complicating pregnancy, unspecified trimester: Secondary | ICD-10-CM

## 2016-04-29 DIAGNOSIS — B169 Acute hepatitis B without delta-agent and without hepatic coma: Secondary | ICD-10-CM

## 2016-04-29 DIAGNOSIS — Z029 Encounter for administrative examinations, unspecified: Secondary | ICD-10-CM | POA: Diagnosis present

## 2016-04-29 DIAGNOSIS — Z124 Encounter for screening for malignant neoplasm of cervix: Secondary | ICD-10-CM

## 2016-04-29 LAB — POCT URINALYSIS DIP (DEVICE)
BILIRUBIN URINE: NEGATIVE
Glucose, UA: NEGATIVE mg/dL
HGB URINE DIPSTICK: NEGATIVE
KETONES UR: NEGATIVE mg/dL
NITRITE: NEGATIVE
PH: 5 (ref 5.0–8.0)
Protein, ur: NEGATIVE mg/dL
Urobilinogen, UA: 0.2 mg/dL (ref 0.0–1.0)

## 2016-04-29 LAB — HEPATIC FUNCTION PANEL
ALT: 91 U/L — ABNORMAL HIGH (ref 6–29)
AST: 62 U/L — ABNORMAL HIGH (ref 10–30)
Albumin: 2.8 g/dL — ABNORMAL LOW (ref 3.6–5.1)
Alkaline Phosphatase: 178 U/L — ABNORMAL HIGH (ref 33–115)
BILIRUBIN DIRECT: 0.1 mg/dL (ref <=0.2)
BILIRUBIN INDIRECT: 0.3 mg/dL (ref 0.2–1.2)
BILIRUBIN TOTAL: 0.4 mg/dL (ref 0.2–1.2)
Total Protein: 5.7 g/dL — ABNORMAL LOW (ref 6.1–8.1)

## 2016-04-29 LAB — OB RESULTS CONSOLE GBS: GBS: NEGATIVE

## 2016-04-29 NOTE — Progress Notes (Signed)
Subjective:  [name] is a 25 y.o. G2P1001 at 8664w4d being seen today for ongoing prenatal care.  She is currently monitored for the following issues for this high-risk pregnancy and has Hepatitis B; Supervision of high risk pregnancy in third trimester; Language barrier, cultural differences; Abnormal quad screen; and Gestational diabetes mellitus, antepartum on her problem list.  Patient reports no complaints.  Contractions: Not present. Vag. Bleeding: None.  Movement: Present. Denies leaking of fluid.   Thinks glucometer is broken, gives same value every time.  The following portions of the patient's history were reviewed and updated as appropriate: allergies, current medications, past family history, past medical history, past social history, past surgical history and problem list. Problem list updated.  Objective:   Filed Vitals:   04/29/16 0847  BP: 114/64  Pulse: 86  Weight: 142 lb 12.8 oz (64.774 kg)    Fetal Status: Fetal Heart Rate (bpm): 135   Movement: Present     General:  Alert, oriented and cooperative. Patient is in no acute distress.  Skin: Skin is warm and dry. No rash noted.   Cardiovascular: Normal heart rate noted  Respiratory: Normal respiratory effort, no problems with respiration noted  Abdomen: Soft, gravid, appropriate for gestational age. Pain/Pressure: Present     Pelvic: Vag. Bleeding: None     Cervical exam deferred        Extremities: Normal range of motion.  Edema: Trace  Mental Status: Normal mood and affect. Normal behavior. Normal judgment and thought content.   Urinalysis: Urine Protein: Negative Urine Glucose: Negative  Assessment and Plan:  Pregnancy: G2P1001 at 2264w4d  # A1GDM - again thinks glucometer is not working. Suspect this patient problem. To see dm educator today. Few recorded values appear wnl - f/u one week - u/s for growth 2.5 wks  # pregnancy - pap, g/c/gbs today, nkda  # Hep b - positive antigen and viral load,  baby will need hbig - hep a antibody (hcv negative in scanned prenatal records); lfts, pt/ptt/inr today  There are no diagnoses linked to this encounter. Preterm labor symptoms and general obstetric precautions including but not limited to vaginal bleeding, contractions, leaking of fluid and fetal movement were reviewed in detail with the patient. Please refer to After Visit Summary for other counseling recommendations.    Kathrynn RunningNoah Bedford Ovidio Steele, MD

## 2016-04-29 NOTE — Progress Notes (Signed)
Diabetes Education: 04/29/16 Comes today noting that her meter is not working properly.  On examination, she had only 1 glucose entry in the meter.  Completed a review of the meter/monitoring process.  Had her do a return demonstration with a great deal of coaching and support.  Reading 2 -3 hrs post breakfast was 146 mg.  This reading was obtained at 2 different finger sticks.  She questioned the occurrence of the dual readings.  I obtained a auto lancet and checked my own blood and obtained a reading of 108 and she agreed that the meter was working.  Instructed again on the fasting and 2 hr post meal schedule and the glucose goals.  Brief review of the diet.  Will continue to follow in clinic for success in the self-management process.  Maggie Iwao Shamblin, RN, RD

## 2016-04-29 NOTE — Progress Notes (Signed)
Urine: small amt leukocytes Patient c/o swollen and bleeding gums Cultures today

## 2016-04-29 NOTE — Progress Notes (Signed)
U/S scheduled 06/08 @ 830am

## 2016-04-30 LAB — CYTOLOGY - PAP

## 2016-04-30 LAB — HEPATITIS A ANTIBODY, TOTAL: HEP A TOTAL AB: REACTIVE — AB

## 2016-04-30 LAB — GC/CHLAMYDIA PROBE AMP (~~LOC~~) NOT AT ARMC
Chlamydia: NEGATIVE
Neisseria Gonorrhea: NEGATIVE

## 2016-04-30 LAB — APTT: APTT: 29 s (ref 24–37)

## 2016-04-30 LAB — PROTIME-INR
INR: 1.06 (ref <1.50)
PROTHROMBIN TIME: 13.9 s (ref 11.6–15.2)

## 2016-05-01 LAB — CULTURE, BETA STREP (GROUP B ONLY)

## 2016-05-13 ENCOUNTER — Ambulatory Visit (INDEPENDENT_AMBULATORY_CARE_PROVIDER_SITE_OTHER): Payer: Medicaid Other | Admitting: Obstetrics and Gynecology

## 2016-05-13 VITALS — BP 110/68 | HR 87 | Wt 146.9 lb

## 2016-05-13 DIAGNOSIS — O24419 Gestational diabetes mellitus in pregnancy, unspecified control: Secondary | ICD-10-CM | POA: Diagnosis present

## 2016-05-13 LAB — POCT URINALYSIS DIP (DEVICE)
Bilirubin Urine: NEGATIVE
Glucose, UA: NEGATIVE mg/dL
Ketones, ur: NEGATIVE mg/dL
NITRITE: NEGATIVE
PH: 6.5 (ref 5.0–8.0)
Protein, ur: NEGATIVE mg/dL
Specific Gravity, Urine: 1.01 (ref 1.005–1.030)
UROBILINOGEN UA: 0.2 mg/dL (ref 0.0–1.0)

## 2016-05-13 LAB — HEMOGLOBIN A1C
Hgb A1c MFr Bld: 6.8 % — ABNORMAL HIGH (ref <5.7)
Mean Plasma Glucose: 148 mg/dL

## 2016-05-13 MED ORDER — GLYBURIDE 2.5 MG PO TABS: 1.2500 mg | ORAL_TABLET | Freq: Every day | ORAL | Status: DC

## 2016-05-13 NOTE — Progress Notes (Signed)
Induction of labor scheduled June 15 @ 730am.

## 2016-05-13 NOTE — Progress Notes (Signed)
Subjective:  [name] is a 25 y.o. G2P1001 at 7342w4d being seen today for ongoing prenatal care.  She is currently monitored for the following issues for this high-risk pregnancy and has Hepatitis B; Supervision of high risk pregnancy in third trimester; Language barrier, cultural differences; Abnormal quad screen; and Gestational diabetes mellitus, antepartum on her problem list.  Patient reports no complaints.  Contractions: Not present. Vag. Bleeding: None.  Movement: Present. Denies leaking of fluid.     The following portions of the patient's history were reviewed and updated as appropriate: allergies, current medications, past family history, past medical history, past social history, past surgical history and problem list. Problem list updated.  Objective:   Filed Vitals:   05/13/16 1055  BP: 110/68  Pulse: 87  Weight: 146 lb 14.4 oz (66.633 kg)    Fetal Status: Fetal Heart Rate (bpm): 142   Movement: Present     General:  Alert, oriented and cooperative. Patient is in no acute distress.  Skin: Skin is warm and dry. No rash noted.   Cardiovascular: Normal heart rate noted  Respiratory: Normal respiratory effort, no problems with respiration noted  Abdomen: Soft, gravid, appropriate for gestational age. Pain/Pressure: Present     Pelvic: Vag. Bleeding: None     Cervical exam deferred        Extremities: Normal range of motion.  Edema: Mild pitting, slight indentation  Mental Status: Normal mood and affect. Normal behavior. Normal judgment and thought content.   Urinalysis: Urine Protein: Negative Urine Glucose: Negative  Assessment and Plan:  Pregnancy: G2P1001 at 3042w4d  # GDM - now a2 - start glyburide 1.25 q am - start nsts this week - u/s for growth scheduled 6/8 - a1c today, suspect possible poor control - iol for 39 weeks, scheduling today  There are no diagnoses linked to this encounter. term labor symptoms and general obstetric precautions including but  not limited to vaginal bleeding, contractions, leaking of fluid and fetal movement were reviewed in detail with the patient. Please refer to After Visit Summary for other counseling recommendations.     Kathrynn RunningNoah Bedford Breanna Harvie, MD

## 2016-05-13 NOTE — Progress Notes (Signed)
Urine: trace hgb, small amt wbcs Amehric int #[id number]

## 2016-05-16 ENCOUNTER — Ambulatory Visit (HOSPITAL_COMMUNITY)
Admission: RE | Admit: 2016-05-16 | Discharge: 2016-05-16 | Disposition: A | Discharge status: Home / Self Care | Payer: Medicaid Other | Source: Ambulatory Visit | Attending: Obstetrics and Gynecology | Admitting: Obstetrics and Gynecology

## 2016-05-16 ENCOUNTER — Ambulatory Visit (INDEPENDENT_AMBULATORY_CARE_PROVIDER_SITE_OTHER): Payer: Medicaid Other | Admitting: *Deleted

## 2016-05-16 ENCOUNTER — Encounter (HOSPITAL_COMMUNITY): Payer: Self-pay

## 2016-05-16 ENCOUNTER — Other Ambulatory Visit: Payer: Self-pay | Admitting: Obstetrics and Gynecology

## 2016-05-16 VITALS — BP 108/66 | HR 82

## 2016-05-16 DIAGNOSIS — O98419 Viral hepatitis complicating pregnancy, unspecified trimester: Secondary | ICD-10-CM

## 2016-05-16 DIAGNOSIS — Z3A38 38 weeks gestation of pregnancy: Secondary | ICD-10-CM | POA: Insufficient documentation

## 2016-05-16 DIAGNOSIS — O24415 Gestational diabetes mellitus in pregnancy, controlled by oral hypoglycemic drugs: Secondary | ICD-10-CM | POA: Insufficient documentation

## 2016-05-16 DIAGNOSIS — B169 Acute hepatitis B without delta-agent and without hepatic coma: Secondary | ICD-10-CM

## 2016-05-16 DIAGNOSIS — B1910 Unspecified viral hepatitis B without hepatic coma: Secondary | ICD-10-CM

## 2016-05-16 DIAGNOSIS — O283 Abnormal ultrasonic finding on antenatal screening of mother: Secondary | ICD-10-CM | POA: Diagnosis not present

## 2016-05-16 DIAGNOSIS — Z3689 Encounter for other specified antenatal screening: Secondary | ICD-10-CM

## 2016-05-16 DIAGNOSIS — O24419 Gestational diabetes mellitus in pregnancy, unspecified control: Secondary | ICD-10-CM

## 2016-05-16 DIAGNOSIS — O98413 Viral hepatitis complicating pregnancy, third trimester: Secondary | ICD-10-CM | POA: Diagnosis not present

## 2016-05-16 HISTORY — DX: Gestational diabetes mellitus in pregnancy, unspecified control: O24.419

## 2016-05-16 NOTE — Progress Notes (Signed)
[employer] interpreter [phone] used for encounter.

## 2016-05-16 NOTE — Progress Notes (Signed)
NST performed today was reviewed and was found to be reactive.  Continue recommended antenatal testing and prenatal care.  

## 2016-05-17 ENCOUNTER — Ambulatory Visit (HOSPITAL_COMMUNITY): Payer: Medicaid Other | Attending: Obstetrics and Gynecology

## 2016-05-20 ENCOUNTER — Encounter: Payer: Self-pay | Admitting: *Deleted

## 2016-05-20 ENCOUNTER — Ambulatory Visit (INDEPENDENT_AMBULATORY_CARE_PROVIDER_SITE_OTHER): Payer: Medicaid Other | Admitting: Obstetrics & Gynecology

## 2016-05-20 VITALS — BP 108/68 | HR 78 | Wt 148.2 lb

## 2016-05-20 DIAGNOSIS — O24419 Gestational diabetes mellitus in pregnancy, unspecified control: Secondary | ICD-10-CM

## 2016-05-20 DIAGNOSIS — O0993 Supervision of high risk pregnancy, unspecified, third trimester: Secondary | ICD-10-CM

## 2016-05-20 LAB — POCT URINALYSIS DIP (DEVICE)
Bilirubin Urine: NEGATIVE
Glucose, UA: NEGATIVE mg/dL
Hgb urine dipstick: NEGATIVE
KETONES UR: NEGATIVE mg/dL
NITRITE: NEGATIVE
PROTEIN: NEGATIVE mg/dL
UROBILINOGEN UA: 0.2 mg/dL (ref 0.0–1.0)
pH: 5.5 (ref 5.0–8.0)

## 2016-05-20 NOTE — Progress Notes (Signed)
Subjective:  [name] is a 25 y.o. G2P1001 at 5721w4d being seen today for ongoing prenatal care.  She is currently monitored for the following issues for this high-risk pregnancy and has Hepatitis B; Supervision of high risk pregnancy in third trimester; Language barrier, cultural differences; Abnormal quad screen; and Gestational diabetes mellitus, antepartum on her problem list. Amharic phone interpreter used.  Patient reports occasional contractions.  Contractions: Irregular. Vag. Bleeding: None.  Movement: Present. Denies leaking of fluid.   The following portions of the patient's history were reviewed and updated as appropriate: allergies, current medications, past family history, past medical history, past social history, past surgical history and problem list. Problem list updated.  Objective:   Filed Vitals:   05/20/16 0953  BP: 108/68  Pulse: 78  Weight: 148 lb 3.2 oz (67.223 kg)    Fetal Status: Fetal Heart Rate (bpm): NST Fundal Height: 41 cm Movement: Present     General:  Alert, oriented and cooperative. Patient is in no acute distress.  Skin: Skin is warm and dry. No rash noted.   Cardiovascular: Normal heart rate noted  Respiratory: Normal respiratory effort, no problems with respiration noted  Abdomen: Soft, gravid, appropriate for gestational age. Pain/Pressure: Present     Pelvic: Cervical exam deferred        Extremities: Normal range of motion.  Edema: Trace  Mental Status: Normal mood and affect. Normal behavior. Normal judgment and thought content.   Urinalysis: Urine Protein: Negative Urine Glucose: Negative CBGs: A few elevated PP, otherwise normal 05/16/16 140w0d EFW 3423g (7 lb 9z)/80%, AC 96%, AFI 16.4 cm, cephalic NST performed today was reviewed and was found to be reactive.    Assessment and Plan:  Pregnancy: G2P1001 at 7921w4d  1. Gestational diabetes mellitus, antepartum Continue Glyburide.  IOL is scheduled on 05/23/16.   2. Supervision of high  risk pregnancy in third trimester Term labor symptoms and general obstetric precautions including but not limited to vaginal bleeding, contractions, leaking of fluid and fetal movement were reviewed in detail with the patient. Please refer to After Visit Summary for other counseling recommendations.  Return in about 6 weeks (around 07/01/2016) for PP visit.  IOL on 6/15.   Tereso NewcomerUgonna A Anyanwu, MD

## 2016-05-20 NOTE — Progress Notes (Signed)
IOL scheduled 05/23/16.  [employer] interpreter # [phone] used for encounter.

## 2016-05-20 NOTE — Patient Instructions (Signed)
Return to clinic for any scheduled appointments or obstetric concerns, or go to [facility] for evaluation  

## 2016-05-22 ENCOUNTER — Encounter: Payer: Self-pay | Admitting: *Deleted

## 2016-05-23 ENCOUNTER — Inpatient Hospital Stay (HOSPITAL_COMMUNITY): Payer: Medicaid Other | Admitting: Anesthesiology

## 2016-05-23 ENCOUNTER — Encounter (HOSPITAL_COMMUNITY): Payer: Self-pay

## 2016-05-23 ENCOUNTER — Inpatient Hospital Stay (HOSPITAL_COMMUNITY)
Admission: RE | Admit: 2016-05-23 | Discharge: 2016-05-26 | DRG: 767 | Disposition: A | Discharge status: Home / Self Care | Payer: Medicaid Other | Source: Ambulatory Visit | Attending: Obstetrics & Gynecology | Admitting: Obstetrics & Gynecology

## 2016-05-23 VITALS — BP 93/60 | HR 95 | Temp 98.3°F | Resp 15 | Ht 60.0 in | Wt 146.0 lb

## 2016-05-23 DIAGNOSIS — O0993 Supervision of high risk pregnancy, unspecified, third trimester: Secondary | ICD-10-CM

## 2016-05-23 DIAGNOSIS — B181 Chronic viral hepatitis B without delta-agent: Secondary | ICD-10-CM | POA: Diagnosis present

## 2016-05-23 DIAGNOSIS — O3663X0 Maternal care for excessive fetal growth, third trimester, not applicable or unspecified: Secondary | ICD-10-CM | POA: Diagnosis present

## 2016-05-23 DIAGNOSIS — Z3A39 39 weeks gestation of pregnancy: Secondary | ICD-10-CM | POA: Diagnosis not present

## 2016-05-23 DIAGNOSIS — O9081 Anemia of the puerperium: Secondary | ICD-10-CM | POA: Diagnosis not present

## 2016-05-23 DIAGNOSIS — O24419 Gestational diabetes mellitus in pregnancy, unspecified control: Secondary | ICD-10-CM | POA: Diagnosis present

## 2016-05-23 DIAGNOSIS — D649 Anemia, unspecified: Secondary | ICD-10-CM | POA: Diagnosis not present

## 2016-05-23 DIAGNOSIS — O9842 Viral hepatitis complicating childbirth: Secondary | ICD-10-CM | POA: Diagnosis present

## 2016-05-23 DIAGNOSIS — B1910 Unspecified viral hepatitis B without hepatic coma: Secondary | ICD-10-CM | POA: Diagnosis not present

## 2016-05-23 DIAGNOSIS — O24425 Gestational diabetes mellitus in childbirth, controlled by oral hypoglycemic drugs: Principal | ICD-10-CM | POA: Diagnosis present

## 2016-05-23 DIAGNOSIS — O24429 Gestational diabetes mellitus in childbirth, unspecified control: Secondary | ICD-10-CM | POA: Diagnosis not present

## 2016-05-23 DIAGNOSIS — O280 Abnormal hematological finding on antenatal screening of mother: Secondary | ICD-10-CM

## 2016-05-23 LAB — GLUCOSE, CAPILLARY
GLUCOSE-CAPILLARY: 80 mg/dL (ref 65–99)
Glucose-Capillary: 64 mg/dL — ABNORMAL LOW (ref 65–99)
Glucose-Capillary: 61 mg/dL — ABNORMAL LOW (ref 65–99)

## 2016-05-23 LAB — CBC
HCT: 25.9 % — ABNORMAL LOW (ref 36.0–46.0)
HEMOGLOBIN: 8.4 g/dL — AB (ref 12.0–15.0)
MCH: 23.5 pg — AB (ref 26.0–34.0)
MCHC: 32.4 g/dL (ref 30.0–36.0)
MCV: 72.5 fL — ABNORMAL LOW (ref 78.0–100.0)
PLATELETS: 161 10*3/uL (ref 150–400)
RBC: 3.57 MIL/uL — AB (ref 3.87–5.11)
RDW: 17.2 % — ABNORMAL HIGH (ref 11.5–15.5)
WBC: 7 10*3/uL (ref 4.0–10.5)

## 2016-05-23 LAB — ABO/RH: ABO/RH(D): O POS

## 2016-05-23 LAB — RPR: RPR: NONREACTIVE

## 2016-05-23 LAB — GLUCOSE, RANDOM: Glucose, Bld: 108 mg/dL — ABNORMAL HIGH (ref 65–99)

## 2016-05-23 MED ORDER — PHENYLEPHRINE 40 MCG/ML (10ML) SYRINGE FOR IV PUSH (FOR BLOOD PRESSURE SUPPORT)
80.0000 ug | PREFILLED_SYRINGE | INTRAVENOUS | Status: DC | PRN
  Filled 2016-05-23: qty 5
  Filled 2016-05-23: qty 10

## 2016-05-23 MED ORDER — LIDOCAINE HCL (PF) 1 % IJ SOLN
30.0000 mL | INTRAMUSCULAR | Status: AC | PRN
  Administered 2016-05-24: 30 mL via SUBCUTANEOUS
  Filled 2016-05-23: qty 30

## 2016-05-23 MED ORDER — FENTANYL 2.5 MCG/ML BUPIVACAINE 1/10 % EPIDURAL INFUSION (WH - ANES)
14.0000 mL/h | INTRAMUSCULAR | Status: DC | PRN
  Administered 2016-05-23 – 2016-05-24 (×2): 14 mL/h via EPIDURAL
  Filled 2016-05-23 (×2): qty 125

## 2016-05-23 MED ORDER — PHENYLEPHRINE 40 MCG/ML (10ML) SYRINGE FOR IV PUSH (FOR BLOOD PRESSURE SUPPORT)
80.0000 ug | PREFILLED_SYRINGE | INTRAVENOUS | Status: DC | PRN
  Filled 2016-05-23: qty 5

## 2016-05-23 MED ORDER — MISOPROSTOL 25 MCG QUARTER TABLET
25.0000 ug | ORAL_TABLET | ORAL | Status: DC | PRN
  Administered 2016-05-23: 25 ug via VAGINAL
  Filled 2016-05-23: qty 1
  Filled 2016-05-23 (×2): qty 0.25

## 2016-05-23 MED ORDER — ACETAMINOPHEN 325 MG PO TABS: 650.0000 mg | ORAL_TABLET | ORAL | Status: DC | PRN

## 2016-05-23 MED ORDER — TERBUTALINE SULFATE 1 MG/ML IJ SOLN: 0.2500 mg | Freq: Once | INTRAMUSCULAR | Status: DC | PRN

## 2016-05-23 MED ORDER — SOD CITRATE-CITRIC ACID 500-334 MG/5ML PO SOLN
30.0000 mL | ORAL | Status: DC | PRN
  Administered 2016-05-23 – 2016-05-24 (×2): 30 mL via ORAL
  Filled 2016-05-23 (×2): qty 15

## 2016-05-23 MED ORDER — LIDOCAINE HCL (PF) 1 % IJ SOLN
INTRAMUSCULAR | Status: DC | PRN
  Administered 2016-05-23 (×2): 5 mL via EPIDURAL

## 2016-05-23 MED ORDER — LACTATED RINGERS IV SOLN: 500.0000 mL | INTRAVENOUS | Status: DC | PRN

## 2016-05-23 MED ORDER — EPHEDRINE 5 MG/ML INJ
10.0000 mg | INTRAVENOUS | Status: DC | PRN
  Filled 2016-05-23: qty 2

## 2016-05-23 MED ORDER — OXYTOCIN 40 UNITS IN LACTATED RINGERS INFUSION - SIMPLE MED
2.5000 [IU]/h | INTRAVENOUS | Status: DC
  Filled 2016-05-23: qty 1000

## 2016-05-23 MED ORDER — LACTATED RINGERS IV SOLN
INTRAVENOUS | Status: DC
  Administered 2016-05-23 – 2016-05-24 (×2): via INTRAVENOUS

## 2016-05-23 MED ORDER — ONDANSETRON HCL 4 MG/2ML IJ SOLN
4.0000 mg | Freq: Four times a day (QID) | INTRAMUSCULAR | Status: DC | PRN
  Administered 2016-05-23: 4 mg via INTRAVENOUS
  Filled 2016-05-23: qty 2

## 2016-05-23 MED ORDER — OXYTOCIN BOLUS FROM INFUSION
500.0000 mL | INTRAVENOUS | Status: DC
  Administered 2016-05-24: 500 mL via INTRAVENOUS

## 2016-05-23 MED ORDER — FENTANYL CITRATE (PF) 100 MCG/2ML IJ SOLN
50.0000 ug | INTRAMUSCULAR | Status: DC | PRN
  Administered 2016-05-23: 75 ug via INTRAVENOUS
  Administered 2016-05-23: 50 ug via INTRAVENOUS
  Filled 2016-05-23 (×2): qty 2

## 2016-05-23 MED ORDER — DIPHENHYDRAMINE HCL 50 MG/ML IJ SOLN: 12.5000 mg | INTRAMUSCULAR | Status: DC | PRN

## 2016-05-23 MED ORDER — LACTATED RINGERS IV SOLN
500.0000 mL | Freq: Once | INTRAVENOUS | Status: AC
  Administered 2016-05-23: 500 mL via INTRAVENOUS

## 2016-05-23 NOTE — Progress Notes (Signed)
   [name] is a 25 y.o. G2P1001 at 1646w0d  admitted for induction of labor due to Diabetes.  Subjective: Comfortable w/epidural  Objective: Filed Vitals:   05/23/16 2021 05/23/16 2032 05/23/16 2101 05/23/16 2131  BP: 106/70 105/62 108/59 112/71  Pulse: 65 72 65 66  Temp:      TempSrc:      Resp: 18 18 18 18   Height:      Weight:      SpO2: 100%         FHT:  FHR: 105 bpm, variability: moderate,  accelerations:  Present,  decelerations:  Absent UC:   irregular, every 1-6 minutes SVE:   Dilation: 5 Effacement (%): 90 Station: -1 Exam by:: B FiservBoyer RN   Labs: Lab Results  Component Value Date   WBC 7.0 05/23/2016   HGB 8.4* 05/23/2016   HCT 25.9* 05/23/2016   MCV 72.5* 05/23/2016   PLT 161 05/23/2016    Assessment / Plan: IOL for A2DM, progressing despite irregular ctx. Will augment w/pitocin if labor stalls out  Labor: Progressing normally Fetal Wellbeing:  Category I Pain Control:  Epidural Anticipated MOD:  NSVD  [name],[name] 05/23/2016, 9:51 PM

## 2016-05-23 NOTE — H&P (Signed)
LABOR AND DELIVERY ADMISSION HISTORY AND PHYSICAL NOTE  [name] is a 25 y.o. female G2P1001 with IUP at 2w0dby LMP presenting for IOL for A2GDM, suspected macrosomia. Started glyburide 2.5 mg once daily in the morning for the last one week.  Reports heartburn and sharp RUQ pain. Reports occasional vision changes for the last three days.  She reports positive fetal movement. She denies leakage of fluid or vaginal bleeding. She is expecting a boy. Not decided about birth control but leans toward IUD (mirena). She is interested in epidural for pain control.   Prenatal History/Complications: Clinic High Risk Prenatal Labs  Dating 12 week [place]oreaBlood type: --/--/O POS (06/15 0815) O+  Genetic Screen Quad: Abnormal   Antibody:NEG (06/15 0815) Neg  Anatomic [place]Normal Rubella:   Immune  GTT  Third trimester: 136  Needs 3 hr>> GDM RPR: NON REAC (04/05 0936) NR  Flu vaccine Undecided HBsAg:   Positive  TDaP vaccine     undecided                                  HIV: NONREACTIVE (04/05 0936) NR  Baby Food    Breast                                        GBS: neg  Contraception    Undecided leaning toward IUD or nexplanon Pap: ASCUS/HPV neg > routine screening  Circumcision  yes, husband has pMultimedia programmer pt has [facility] visitorPerson   Husband    Past Medical History: Past Medical History  Diagnosis Date  . Hepatitis B infection     +test in first pregancy  . Gestational diabetes     Past Surgical History: History reviewed. No pertinent past surgical history.  Obstetrical History: OB History    Gravida Para Term Preterm AB TAB SAB Ectopic Multiple Living   2 1 1       1       Social History: Social History   Social History  . Marital Status: Married    Spouse Name: N/A  . Number of Children: N/A  . Years of Education: N/A   Social History Main Topics  . Smoking status: Never Smoker   . Smokeless tobacco: Never Used  . Alcohol Use: No  . Drug  Use: No  . Sexual Activity: Yes    Birth Control/ Protection: None   Other Topics Concern  . None   Social History Narrative    Family History: History reviewed. No pertinent family history.  Allergies: No Known Allergies  Prescriptions prior to admission  Medication Sig Dispense Refill Last Dose  . ACCU-CHEK FASTCLIX LANCETS MISC 1 Device by Percutaneous route 4 (four) times daily. 100 each 12 Taking  . Blood Glucose Monitoring Suppl (ACCU-CHEK NANO SMARTVIEW) w/Device KIT 1 kit by Does not apply route once. 1 kit 0 Taking  . famotidine (PEPCID) 20 MG tablet Take 1 tablet (20 mg total) by mouth 2 (two) times daily. (Patient not taking: Reported on 04/29/2016) 60 tablet 3 Not Taking  . glucose blood test strip Use as instructed 100 each 12 Taking  . glyBURIDE (DIABETA) 2.5 MG tablet Take 0.5 tablets (1.25 mg total) by mouth daily with breakfast. 30 tablet 3 Taking  . metoCLOPramide (  REGLAN) 10 MG tablet Take 1 tablet (10 mg total) by mouth 4 (four) times daily as needed for nausea or vomiting. (Patient not taking: Reported on 04/29/2016) 30 tablet 2 Not Taking  . nitrofurantoin, macrocrystal-monohydrate, (MACROBID) 100 MG capsule Take 1 capsule (100 mg total) by mouth 2 (two) times daily. (Patient not taking: Reported on 04/29/2016) 14 capsule 1 Not Taking  . pantoprazole (PROTONIX) 40 MG tablet Take 1 tablet (40 mg total) by mouth daily. (Patient not taking: Reported on 05/13/2016) 30 tablet 3 Not Taking  . Prenatal Multivit-Min-Fe-FA (PRENATAL VITAMINS) 0.8 MG tablet Take 1 tablet by mouth daily. (Patient not taking: Reported on 04/29/2016) 30 tablet 12 Not Taking     Review of Systems   All systems reviewed and negative except as stated in HPI  Blood pressure 121/79, pulse 85, temperature 98.2 F (36.8 C), temperature source Oral, resp. rate 18, height 5' (1.524 m), weight 148 lb (67.132 kg), last menstrual period 07/25/2015. General appearance: alert, cooperative and appears stated  age Lungs: clear to auscultation bilaterally Heart: regular rate and rhythm Abdomen: soft, non-tender; bowel sounds normal Extremities: No calf swelling or tenderness Presentation: cephalic Fetal monitoring:  Uterine activity:  Dilation: Closed Effacement (%): 70 Station: -2 Exam by:: Gonfa   Prenatal labs: ABO, Rh: --/--/O POS (06/15 0815)O positive Antibody: NEG (06/15 0815)Negative Rubella: Immune RPR: NON REAC (04/05 0936)  HBsAg:  positive HIV: NONREACTIVE (04/05 0936)  GBS: Negative (05/22 0000) Negative 3 hr Glucola:03/28/16- 94/200/166/147 Genetic screening:  1:69 AFP  Anatomy US: 05/16/16 1w0dEFW 3423g (7 lb 9z)/80%, AC 96%, AFI 193.2cm, cephalic  Prenatal Transfer Tool  Maternal Diabetes: Yes:  Diabetes Type:  Insulin/Medication controlled Genetic Screening: Abnormal:  Results: Elevated risk of Trisomy 21 Maternal Ultrasounds/Referrals: Normal Fetal Ultrasounds or other Referrals:  None Maternal Substance Abuse:  No Significant Maternal Medications:  None Significant Maternal Lab Results: None  Results for orders placed or performed during the hospital encounter of 05/23/16 (from the past 24 hour(s))  CBC   Collection Time: 05/23/16  8:15 AM  Result Value Ref Range   WBC 7.0 4.0 - 10.5 K/uL   RBC 3.57 (L) 3.87 - 5.11 MIL/uL   Hemoglobin 8.4 (L) 12.0 - 15.0 g/dL   HCT 25.9 (L) 36.0 - 46.0 %   MCV 72.5 (L) 78.0 - 100.0 fL   MCH 23.5 (L) 26.0 - 34.0 pg   MCHC 32.4 30.0 - 36.0 g/dL   RDW 17.2 (H) 11.5 - 15.5 %   Platelets 161 150 - 400 K/uL  Glucose, random   Collection Time: 05/23/16  8:15 AM  Result Value Ref Range   Glucose, Bld 108 (H) 65 - 99 mg/dL  Type and screen   Collection Time: 05/23/16  8:15 AM  Result Value Ref Range   ABO/RH(D) O POS    Antibody Screen NEG    Sample Expiration 05/26/2016     Patient Active Problem List   Diagnosis Date Noted  . Gestational diabetes mellitus 05/23/2016  . Gestational diabetes mellitus, antepartum  03/16/2016  . Hepatitis B 03/13/2016  . Supervision of high risk pregnancy in third trimester 03/13/2016  . Language barrier, cultural differences 03/13/2016  . Abnormal quad screen 03/13/2016    Assessment: Breanna Kildowis a 25y.o. G2P1001 at 331w0dere for IOL for A2GDM, suspected macrosomia  #A2GDM: CBG 105 here. Will monitor CBG q4h #Labor:IOL with cytotec #Pain:Epidural #FWB:  CAT-1 #ID: GBS neg. Hep B positive #MOF: Breast #MOC:Mirena. Need more inforamtion #  Circ:  Outpatient.   Mercy Riding 05/23/2016, 10:01 AM  I have seen this patient and agree with the above resident's note.  LEFTWICH-KIRBY, [place] Certified Nurse-Midwife

## 2016-05-23 NOTE — Anesthesia Procedure Notes (Signed)
Epidural Patient location during procedure: OB Start time: 05/23/2016 7:19 PM End time: 05/23/2016 7:38 PM  Staffing Anesthesiologist: Heather RobertsSINGER, Altagracia Rone Performed by: anesthesiologist   Preanesthetic Checklist Completed: patient identified, site marked, pre-op evaluation, timeout performed, IV checked, risks and benefits discussed and monitors and equipment checked  Epidural Patient position: sitting Prep: DuraPrep Patient monitoring: heart rate, cardiac monitor, continuous pulse ox and blood pressure Approach: midline Location: L2-L3 Injection technique: LOR saline  Needle:  Needle type: Tuohy  Needle gauge: 17 G Needle length: 9 cm Needle insertion depth: 5 cm Catheter size: 20 Guage Catheter at skin depth: 10 cm Test dose: negative and Other  Assessment Events: blood not aspirated, injection not painful, no injection resistance and negative IV test  Additional Notes Informed consent obtained prior to proceeding including risk of failure, 1% risk of PDPH, risk of minor discomfort and bruising.  Discussed rare but serious complications including epidural abscess, permanent nerve injury, epidural hematoma.  Discussed alternatives to epidural analgesia and patient desires to proceed.  Timeout performed pre-procedure verifying patient name, procedure, and platelet count.  Patient tolerated procedure well.

## 2016-05-23 NOTE — Plan of Care (Signed)
Dr.Gonfa at bedside speaks fluent [place]. Using him to interpret for the pt even using PAcifica having a hard time understanding interpreter. Pt agreeable.

## 2016-05-23 NOTE — Anesthesia Pain Management Evaluation Note (Signed)
  CRNA Pain Management Visit Note  Patient: [name], 25 y.o., female  "Hello I am a [facility] of the anesthesia [facility] at [facility]. We have an anesthesia team available at all times to provide care throughout the hospital, including epidural management and anesthesia for C-section. I don't know your plan for the delivery whether it a natural birth, water birth, IV sedation, nitrous supplementation, doula or epidural, but we want to meet your pain goals."   1.Was your pain managed to your expectations on prior hospitalizations?   Unable to assess -   2.What is your expectation for pain management during this hospitalization?     Epidural  3.How can we help you reach that goal? Maintain pain goal  Record the patient's initial score and the patient's pain goal.   Pain: 5, but after the epidural it is now a 0.  Pain Goal: 1 [facility] wants you to be able to say your pain was always managed very well.  [name] 05/23/2016

## 2016-05-23 NOTE — Progress Notes (Signed)
   [name] is a 25 y.o. G2P1001 at 6780w0d  admitted for induction of labor due to Gestational diabetes.  Subjective: Comfortable with epidural  Objective: Filed Vitals:   05/23/16 2200 05/23/16 2231 05/23/16 2301 05/23/16 2331  BP: 131/86 126/86 126/87 101/59  Pulse: 58 100 69 64  Temp:      TempSrc:      Resp: 18 18 18 18   Height:      Weight:      SpO2:       Total I/O In: -  Out: 750 [Urine:750]  FHT:  FHR: 110 bpm, variability: moderate,  accelerations:  Present,  decelerations:  Absent UC:   irregular, every 1-6 minutes SVE:   Dilation: 6.5 Effacement (%): 70 Station: -2 Exam by:: Cresenzo Dishmon  Labs: Lab Results  Component Value Date   WBC 7.0 05/23/2016   HGB 8.4* 05/23/2016   HCT 25.9* 05/23/2016   MCV 72.5* 05/23/2016   PLT 161 05/23/2016    Assessment / Plan: IOL for DM, progressing despite irregular labor pattern  Labor: Progressing normally Fetal Wellbeing:  Category I Pain Control:  Epidural Anticipated MOD:  NSVD  [name],[name] 05/23/2016, 11:54 PM

## 2016-05-23 NOTE — Anesthesia Preprocedure Evaluation (Signed)
Anesthesia Evaluation  Patient identified by MRN, date of birth, ID band Patient awake    Reviewed: Allergy & Precautions, NPO status , Patient's Chart, lab work & pertinent test results  History of Anesthesia Complications Negative for: history of anesthetic complications  Airway Mallampati: II  TM Distance: >3 FB Neck ROM: Full    Dental  (+) Dental Advisory Given, Teeth Intact   Pulmonary neg pulmonary ROS,    Pulmonary exam normal        Cardiovascular negative cardio ROS Normal cardiovascular exam     Neuro/Psych negative neurological ROS  negative psych ROS   GI/Hepatic negative GI ROS, Neg liver ROS, (+) Hepatitis -, B  Endo/Other  negative endocrine ROSdiabetes  Renal/GU negative Renal ROS  negative genitourinary   Musculoskeletal negative musculoskeletal ROS (+)   Abdominal   Peds negative pediatric ROS (+)  Hematology negative hematology ROS (+)   Anesthesia Other Findings   Reproductive/Obstetrics (+) Pregnancy                             Anesthesia Physical Anesthesia Plan  ASA: II  Anesthesia Plan: Epidural   Post-op Pain Management:    Induction:   Airway Management Planned:   Additional Equipment:   Intra-op Plan:   Post-operative Plan:   Informed Consent: I have reviewed the patients History and Physical, chart, labs and discussed the procedure including the risks, benefits and alternatives for the proposed anesthesia with the patient or authorized representative who has indicated his/her understanding and acceptance.   Dental advisory given  Plan Discussed with: Anesthesiologist  Anesthesia Plan Comments:         Anesthesia Quick Evaluation

## 2016-05-23 NOTE — Progress Notes (Signed)
[name] N of blood sugar pt given some juices to drink and will now be allowed regular diet.

## 2016-05-23 NOTE — Progress Notes (Signed)
Labor Progress Note [name] is a 25 y.o. G2P1001 at 3846w0d presented for IOL for A2GDM S: Reports contraction and nausea. Pain 7/10.   O:  BP 127/73 mmHg  Pulse 67  Temp(Src) 98.2 F (36.8 C) (Oral)  Resp 16  Ht 5' (1.524 m)  Wt 148 lb (67.132 kg)  BMI 28.90 kg/m2  LMP 07/25/2015 (Approximate) EFM: 140/mod var/+acce/neg decels  CVE: Dilation: Closed Effacement (%): 70 Station: -2 Presentation: Vertex Exam by:: Dr. Alanda SlimGonfa   A&P: 25 y.o. G2P1001 5746w0d admitted for IOL for A2GDM #Labor: tachysystole after first Cytotec. Cervix remains closed. Will hold off Cytotec for few hours #Pain: as needed  #FWB: CAT-1 #GBS: negative  Almon Herculesaye T Gonfa, MD 3:26 PM

## 2016-05-24 ENCOUNTER — Encounter (HOSPITAL_COMMUNITY): Payer: Self-pay

## 2016-05-24 DIAGNOSIS — B1910 Unspecified viral hepatitis B without hepatic coma: Secondary | ICD-10-CM

## 2016-05-24 DIAGNOSIS — O9842 Viral hepatitis complicating childbirth: Secondary | ICD-10-CM

## 2016-05-24 DIAGNOSIS — D649 Anemia, unspecified: Secondary | ICD-10-CM

## 2016-05-24 DIAGNOSIS — O24429 Gestational diabetes mellitus in childbirth, unspecified control: Secondary | ICD-10-CM

## 2016-05-24 DIAGNOSIS — Z3A39 39 weeks gestation of pregnancy: Secondary | ICD-10-CM

## 2016-05-24 DIAGNOSIS — O9902 Anemia complicating childbirth: Secondary | ICD-10-CM

## 2016-05-24 LAB — PREPARE RBC (CROSSMATCH)

## 2016-05-24 LAB — CBC
HCT: 27.9 % — ABNORMAL LOW (ref 36.0–46.0)
Hemoglobin: 8.8 g/dL — ABNORMAL LOW (ref 12.0–15.0)
MCH: 23.4 pg — ABNORMAL LOW (ref 26.0–34.0)
MCHC: 31.5 g/dL (ref 30.0–36.0)
MCV: 74.2 fL — ABNORMAL LOW (ref 78.0–100.0)
PLATELETS: 157 10*3/uL (ref 150–400)
RBC: 3.76 MIL/uL — AB (ref 3.87–5.11)
RDW: 17.5 % — AB (ref 11.5–15.5)
WBC: 10.6 10*3/uL — AB (ref 4.0–10.5)

## 2016-05-24 LAB — GLUCOSE, CAPILLARY
GLUCOSE-CAPILLARY: 75 mg/dL (ref 65–99)
GLUCOSE-CAPILLARY: 67 mg/dL (ref 65–99)
Glucose-Capillary: 52 mg/dL — ABNORMAL LOW (ref 65–99)

## 2016-05-24 MED ORDER — ONDANSETRON HCL 4 MG/2ML IJ SOLN
4.0000 mg | INTRAMUSCULAR | Status: DC | PRN
Start: 2016-05-24 — End: 2016-05-26

## 2016-05-24 MED ORDER — DIBUCAINE 1 % RE OINT: 1.0000 "application " | TOPICAL_OINTMENT | RECTAL | Status: DC | PRN

## 2016-05-24 MED ORDER — WITCH HAZEL-GLYCERIN EX PADS: 1.0000 "application " | MEDICATED_PAD | CUTANEOUS | Status: DC | PRN

## 2016-05-24 MED ORDER — BENZOCAINE-MENTHOL 20-0.5 % EX AERO
1.0000 | INHALATION_SPRAY | CUTANEOUS | Status: DC | PRN
Start: 2016-05-24 — End: 2016-05-26

## 2016-05-24 MED ORDER — MEASLES, MUMPS & RUBELLA VAC ~~LOC~~ INJ
0.5000 mL | INJECTION | Freq: Once | SUBCUTANEOUS | Status: DC
  Filled 2016-05-24: qty 0.5

## 2016-05-24 MED ORDER — FERROUS SULFATE 325 (65 FE) MG PO TABS
325.0000 mg | ORAL_TABLET | Freq: Two times a day (BID) | ORAL | Status: DC
Start: 2016-05-24 — End: 2016-05-26
  Administered 2016-05-25 – 2016-05-26 (×3): 325 mg via ORAL
  Filled 2016-05-24 (×3): qty 1

## 2016-05-24 MED ORDER — ONDANSETRON HCL 4 MG PO TABS: 4.0000 mg | ORAL_TABLET | ORAL | Status: DC | PRN

## 2016-05-24 MED ORDER — ACETAMINOPHEN 325 MG PO TABS
650.0000 mg | ORAL_TABLET | ORAL | Status: DC | PRN
  Administered 2016-05-24: 650 mg via ORAL
  Filled 2016-05-24: qty 2

## 2016-05-24 MED ORDER — PRENATAL MULTIVITAMIN CH
1.0000 | ORAL_TABLET | Freq: Every day | ORAL | Status: DC
  Administered 2016-05-25: 1 via ORAL
  Filled 2016-05-24: qty 1

## 2016-05-24 MED ORDER — MISOPROSTOL 200 MCG PO TABS
ORAL_TABLET | ORAL | Status: AC
  Administered 2016-05-24: 1000 ug via RECTAL
  Filled 2016-05-24: qty 5

## 2016-05-24 MED ORDER — SIMETHICONE 80 MG PO CHEW: 80.0000 mg | CHEWABLE_TABLET | ORAL | Status: DC | PRN

## 2016-05-24 MED ORDER — PANTOPRAZOLE SODIUM 40 MG PO TBEC
40.0000 mg | DELAYED_RELEASE_TABLET | Freq: Every day | ORAL | Status: DC
  Administered 2016-05-25: 40 mg via ORAL
  Filled 2016-05-24: qty 1

## 2016-05-24 MED ORDER — COCONUT OIL OIL: 1.0000 "application " | TOPICAL_OIL | Status: DC | PRN

## 2016-05-24 MED ORDER — OXYTOCIN 40 UNITS IN LACTATED RINGERS INFUSION - SIMPLE MED: 10.0000 [IU]/h | INTRAVENOUS | Status: DC

## 2016-05-24 MED ORDER — IBUPROFEN 600 MG PO TABS
600.0000 mg | ORAL_TABLET | Freq: Four times a day (QID) | ORAL | Status: DC
  Administered 2016-05-24 – 2016-05-26 (×7): 600 mg via ORAL
  Filled 2016-05-24 (×7): qty 1

## 2016-05-24 MED ORDER — OXYTOCIN 40 UNITS IN LACTATED RINGERS INFUSION - SIMPLE MED
1.0000 m[IU]/min | INTRAVENOUS | Status: DC
  Administered 2016-05-24: 1 m[IU]/min via INTRAVENOUS

## 2016-05-24 MED ORDER — MISOPROSTOL 200 MCG PO TABS
1000.0000 ug | ORAL_TABLET | Freq: Once | ORAL | Status: AC
  Administered 2016-05-24: 1000 ug via RECTAL

## 2016-05-24 MED ORDER — MAGNESIUM HYDROXIDE 400 MG/5ML PO SUSP: 30.0000 mL | ORAL | Status: DC | PRN

## 2016-05-24 MED ORDER — OXYTOCIN 40 UNITS IN LACTATED RINGERS INFUSION - SIMPLE MED: 1.0000 m[IU]/min | INTRAVENOUS | Status: DC

## 2016-05-24 MED ORDER — TERBUTALINE SULFATE 1 MG/ML IJ SOLN: 0.2500 mg | Freq: Once | INTRAMUSCULAR | Status: DC | PRN

## 2016-05-24 MED ORDER — TETANUS-DIPHTH-ACELL PERTUSSIS 5-2.5-18.5 LF-MCG/0.5 IM SUSP: 0.5000 mL | Freq: Once | INTRAMUSCULAR | Status: DC

## 2016-05-24 MED ORDER — SODIUM CHLORIDE 0.9 % IV SOLN: Freq: Once | INTRAVENOUS | Status: DC

## 2016-05-24 MED ORDER — OXYCODONE-ACETAMINOPHEN 5-325 MG PO TABS: 2.0000 | ORAL_TABLET | ORAL | Status: DC | PRN

## 2016-05-24 MED ORDER — FAMOTIDINE 20 MG PO TABS
20.0000 mg | ORAL_TABLET | Freq: Two times a day (BID) | ORAL | Status: DC
  Administered 2016-05-25 (×2): 20 mg via ORAL
  Filled 2016-05-24 (×2): qty 1

## 2016-05-24 MED ORDER — ZOLPIDEM TARTRATE 5 MG PO TABS: 5.0000 mg | ORAL_TABLET | Freq: Every evening | ORAL | Status: DC | PRN

## 2016-05-24 MED ORDER — OXYCODONE-ACETAMINOPHEN 5-325 MG PO TABS: 1.0000 | ORAL_TABLET | ORAL | Status: DC | PRN

## 2016-05-24 MED ORDER — SENNOSIDES-DOCUSATE SODIUM 8.6-50 MG PO TABS
2.0000 | ORAL_TABLET | ORAL | Status: DC
  Administered 2016-05-24: 2 via ORAL
  Filled 2016-05-24: qty 2

## 2016-05-24 MED ORDER — DIPHENHYDRAMINE HCL 25 MG PO CAPS: 25.0000 mg | ORAL_CAPSULE | Freq: Four times a day (QID) | ORAL | Status: DC | PRN

## 2016-05-24 NOTE — Consult Note (Signed)
[facility]  Delivery Note:  SVD    05/24/2016  10:12 AM  I was called to the delivery room at the request of the patient's obstetrician (Dr. Erin FullingHarraway-Smith) for a code APGAR when infant became pale and limp at about 5 minutes of age.     PRENATAL HX:  This is a 25 y/o G2P1001 at 7238 and 1/[redacted] weeks gestation who was admitted yesterday for IOL for A2GDM and suspected macrosomia.  She was on glyburide at the time of delivery.  Her pregnancy has been complicated by positive Hepatitis B.  She is GBS negative, no recent infection, and AROM was 2 hours prior to delivery.    DELIVERY:  Infant was vigorous at delivery, requiring no resuscitation other than standard warming, drying and stimulation.  APGARs 9 and 9.  However, at about 5 minutes of age he became pale and floppy so code APGAR was called.  NICU team arrived and initiated PPV for apnea. HR initially below 100 but quickly improved so PPV stopped.  Infant began to cry on and off, but HR dropped to 60 about 1 minute after PPV stopped, so PPV resumed.  PPV administered again for ~60 seconds, with improvement in HR, color, and tone.  When PPV stopped, HR remained in 150s but O2 saturations declined to low 80s so blow-by-O2 was administered.  Infant will be admitted to NICU for respiratory support and a sepsis evaluation.    _____________________ Electronically Signed By: Maryan CharLindsey Wilder Kurowski, MD Neonatologist

## 2016-05-24 NOTE — Progress Notes (Signed)
Cx 8/80/-1.  AROM w/clear fluid. IUPC placed. FHR cat 1.  Will continue to increase pitocin until labor pattern is adequate.

## 2016-05-24 NOTE — Anesthesia Postprocedure Evaluation (Signed)
Anesthesia Post Note  Patient: [name]  Procedure(s) Performed: * No procedures listed *  Patient location during evaluation: Mother Baby Anesthesia Type: Epidural Level of consciousness: awake and alert Pain management: satisfactory to patient Vital Signs Assessment: post-procedure vital signs reviewed and stable Respiratory status: respiratory function stable Cardiovascular status: stable Postop Assessment: no headache, no backache, epidural receding, patient able to bend at knees, no signs of nausea or vomiting and adequate PO intake Anesthetic complications: no Comments: Comfort level was assessed by AnesthesiaTeam and the patient was pleased with the care, interventions, and services provided by the Department of Anesthesia.     Last Vitals:  Filed Vitals:   05/24/16 1340 05/24/16 1807  BP: 138/69 94/56  Pulse: 85 84  Temp: 37.1 C 36.9 C  Resp: 18 18    Last Pain:  Filed Vitals:   05/24/16 1948  PainSc: 0-No pain   Pain Goal: Patients Stated Pain Goal: 3 (05/24/16 1930)               Karleen DolphinFUSSELL,Farhaan Mabee

## 2016-05-24 NOTE — Progress Notes (Signed)
LABOR PROGRESS NOTE  [name] is a 25 y.o. G2P1001 at 9638w1d  admitted for IOL for A2GDM  Subjective: Pt is comfortable with epidural, lying on right lateral with peanut.    Objective: BP 116/83 mmHg  Pulse 68  Temp(Src) 97.8 F (36.6 C) (Axillary)  Resp 18  Ht 5' (1.524 m)  Wt 148 lb (67.132 kg)  BMI 28.90 kg/m2  SpO2 100%  LMP 07/25/2015 (Approximate) or  Filed Vitals:   05/24/16 0401 05/24/16 0431 05/24/16 0501 05/24/16 0531  BP: 104/62 117/75 122/82 116/83  Pulse: 72 73 73 68  Temp:  97.8 F (36.6 C)    TempSrc:      Resp: 18 18 18 18   Height:      Weight:      SpO2:         Dilation: 7 Effacement (%): 80 Cervical Position: Middle Station: -1 Presentation: Vertex Exam by:: B Boyer   Labs: Lab Results  Component Value Date   WBC 7.0 05/23/2016   HGB 8.4* 05/23/2016   HCT 25.9* 05/23/2016   MCV 72.5* 05/23/2016   PLT 161 05/23/2016    Patient Active Problem List   Diagnosis Date Noted  . Gestational diabetes mellitus 05/23/2016  . Gestational diabetes mellitus, antepartum 03/16/2016  . Hepatitis B 03/13/2016  . Supervision of high risk pregnancy in third trimester 03/13/2016  . Language barrier, cultural differences 03/13/2016  . Abnormal quad screen 03/13/2016    Assessment / Plan: 25 y.o. G2P1001 at 7038w1d here for IOL for A2GDM  Labor: Active, now on pitocin after cytotec/FB, no change since 1AM today, consider AROM Fetal Wellbeing:  Cat I Pain Control:  Epidural Anticipated MOD:  NSVD  Olena LeatherwoodKelly M Annison Birchard, MD 05/24/2016, 5:45 AM

## 2016-05-24 NOTE — Lactation Note (Signed)
This note was copied from a baby's chart. Lactation Consultation Note  Patient Name: [name]'s Date: 05/24/2016 Reason for consult: Initial assessment;NICU baby   Initial consult with exp BF mom of term NICU infant. Spoke with mother through [employer] Interpreter [employer] # [phone]. History significant for + Hep B, infant has received Hep B Vaccine and HBIG. Mom with GDM during pregnancy.  Mom did not want to start pumping as she reported her breasts are empty. Discussed supply and demand with mom importance to begin pumping since infant is in NICU. Mom voiced understanding and agreed to begin pumping.  DEBP set up with instructions for set up, Assembling, disassembling, pumping, cleaning pump parts and milk storage for NICU infant. Enc mom to pump every 2-3 hours for 15 minutes on Initiate setting followed by hand expression.   Mom know how to hand express and colostrum was readily expressible. Was able to hand express 2-3 cc and milk was taken to NICU. [facility] and does not have a pump at home. [facility]   GAve mom Providing Milk for Your Baby in NICU handout and reviewed milk storage and pumping. Gave mom Hand expression handout and BF resources handout. Mom has colostrum collection containers and # stickers. Will order Breastmilk labels in NICU. [facility] Brochure given, informed mom of IP/OP Services, BF Support Groups and LC phone #. Enc mom to call with questions/concerns. Follow up tomorrow and prn.   Will follow up tomorrow and PRN.    Maternal Data Has patient been taught Hand Expression?: Yes Does the patient have breastfeeding experience prior to this delivery?: Yes  Feeding    LATCH Score/Interventions                      Lactation Tools Discussed/Used WIC Program: Yes Pump Review: Setup, frequency, and cleaning;Milk Storage (for NICU infant) Initiated by:: Noralee StainSharon Clester Chlebowski, RN, IBCLC Date initiated::  05/24/16   Consult Status Consult Status: Follow-up Date: 05/25/16 Follow-up type: In-patient    Silas FloodSharon S Lamonte Hartt 05/24/2016, 4:01 PM

## 2016-05-25 LAB — GLUCOSE, CAPILLARY
GLUCOSE-CAPILLARY: 161 mg/dL — AB (ref 65–99)
GLUCOSE-CAPILLARY: 132 mg/dL — AB (ref 65–99)
Glucose-Capillary: 141 mg/dL — ABNORMAL HIGH (ref 65–99)

## 2016-05-25 LAB — CBC
HEMATOCRIT: 18.6 % — AB (ref 36.0–46.0)
Hemoglobin: 6.2 g/dL — CL (ref 12.0–15.0)
MCH: 23.9 pg — ABNORMAL LOW (ref 26.0–34.0)
MCHC: 33.3 g/dL (ref 30.0–36.0)
MCV: 71.8 fL — AB (ref 78.0–100.0)
PLATELETS: 129 10*3/uL — AB (ref 150–400)
RBC: 2.59 MIL/uL — AB (ref 3.87–5.11)
RDW: 17.5 % — ABNORMAL HIGH (ref 11.5–15.5)
WBC: 14.8 10*3/uL — AB (ref 4.0–10.5)

## 2016-05-25 MED ORDER — SENNOSIDES-DOCUSATE SODIUM 8.6-50 MG PO TABS
1.0000 | ORAL_TABLET | Freq: Every evening | ORAL | Status: DC | PRN
  Administered 2016-05-25: 1 via ORAL
  Filled 2016-05-25: qty 1

## 2016-05-25 MED ORDER — METFORMIN HCL 500 MG PO TABS
500.0000 mg | ORAL_TABLET | Freq: Two times a day (BID) | ORAL | Status: DC
  Administered 2016-05-25 – 2016-05-26 (×3): 500 mg via ORAL
  Filled 2016-05-25 (×3): qty 1

## 2016-05-25 NOTE — Progress Notes (Addendum)
Daily Post Partum Note  [name] is a 25 y.o. G2X5284 PPD#1 s/p  SVD/2nd  @ [redacted]w[redacted]d  Pregnancy c/b chronic hep B, BMI 29, GDMA2 (glyburide). Patient had PClinton8032mand needed manual extraction of placenta to help with bleeding  24hr/overnight events:  Fasting BS this AM 161-->started on metformin by o/n team. Hct 18.6 from 28  Subjective:  No s/s of anemia. Has been up to the bathroom. Minimal and decreasing VB. Some discomfort at the epidural site and some some abdominal cramping but no fevers, chills.  Objective:    Current Vital Signs 24h Vital Sign Ranges  T 98 F (36.7 C) Temp  Avg: 98.5 F (36.9 C)  Min: 98 F (36.7 C)  Max: 99.4 F (37.4 C)  BP 118/79 mmHg BP  Min: 94/56  Max: 147/91  HR 86 Pulse  Avg: 87.5  Min: 74  Max: 124  RR 18 Resp  Avg: 17.9  Min: 17  Max: 18  SaO2 100 % Not Delivered SpO2  Avg: 100 %  Min: 100 %  Max: 100 %       24 Hour I/O Current Shift I/O  Time Ins Outs 06/16 0701 - 06/17 0700 In: -  Out: 1150 [Urine:350]      General: NAD Abdomen: FF below the umbilicus, nttp, ND Perineum: deferred Back: no CVAT. No obvious erythema, nttp to minimally ttp at epidural site Skin:  Warm and dry.  Cardiovascular: S1, S2 normal, no murmur, rub or gallop, regular rate and rhythm Respiratory:  Clear to auscultation bilateral. Normal respiratory effort Extremities: no c/c/e  Medications Current Facility-Administered Medications  Medication Dose Route Frequency Provider Last Rate Last Dose  . acetaminophen (TYLENOL) tablet 650 mg  650 mg Oral Q4H PRN ViManya SilvasCNM   650 mg at 05/24/16 2148  . benzocaine-Menthol (DERMOPLAST) 20-0.5 % topical spray 1 application  1 application Topical PRN ViManya SilvasCNM      . coconut oil  1 application Topical PRN ViManya SilvasCNM      . witch hazel-glycerin (TUCKS) pad 1 application  1 application Topical PRN ViManya SilvasCNM       And  . dibucaine (NUPERCAINAL) 1 % rectal ointment 1 application  1  application Rectal PRN ViManya SilvasCNM      . diphenhydrAMINE (BENADRYL) capsule 25 mg  25 mg Oral Q6H PRN ViManya SilvasCNM      . famotidine (PEPCID) tablet 20 mg  20 mg Oral BID TaMercy RidingMD      . ferrous sulfate tablet 325 mg  325 mg Oral BID WC ViManya SilvasCNM      . ibuprofen (ADVIL,MOTRIN) tablet 600 mg  600 mg Oral Q6H ViManya SilvasCNM   600 mg at 05/25/16 0542  . lidocaine (PF) (XYLOCAINE) 1 % injection 30 mL  30 mL Subcutaneous PRN NoGwynne EdingerMD   30 mL at 05/24/16 1000  . magnesium hydroxide (MILK OF MAGNESIA) suspension 30 mL  30 mL Oral Q3 days PRN ViManya SilvasCNM      . measles, mumps and rubella vaccine (MMR) injection 0.5 mL  0.5 mL Subcutaneous Once [place]    . metFORMIN (GLUCOPHAGE) tablet 500 mg  500 mg Oral BID WC KiCaren MacadamMD      . ondansetron (ZMedical [facility] Surgery Centertablet 4 mg  4 mg Oral Q4H PRN ViManya SilvasCNM       Or  . ondansetron ([facility] Hospitalinjection 4  mg  4 mg Intravenous Q4H PRN Manya Silvas, CNM      . oxyCODONE-acetaminophen (PERCOCET/ROXICET) 5-325 MG per tablet 1 tablet  1 tablet Oral Q4H PRN Manya Silvas, CNM      . oxyCODONE-acetaminophen (PERCOCET/ROXICET) 5-325 MG per tablet 2 tablet  2 tablet Oral Q4H PRN Manya Silvas, CNM      . oxytocin (PITOCIN) IV infusion 40 units in LR 1000 mL - Premix  10 Units/hr Intravenous Continuous Lavonia Drafts, MD      . pantoprazole (PROTONIX) EC tablet 40 mg  40 mg Oral Daily Mercy Riding, MD      . prenatal multivitamin tablet 1 tablet  1 tablet Oral Q1200 Manya Silvas, CNM      . senna-docusate (Senokot-S) tablet 2 tablet  2 tablet Oral Q24H Manya Silvas, CNM   2 tablet at 05/24/16 2345  . simethicone (MYLICON) chewable tablet 80 mg  80 mg Oral PRN Manya Silvas, CNM      . Tdap (BOOSTRIX) injection 0.5 mL  0.5 mL Intramuscular Once [place], [place]      . zolpidem (AMBIEN) tablet 5 mg  5 mg Oral QHS PRN Manya Silvas, CNM        Labs:   Recent  Labs Lab 05/23/16 0815 05/24/16 1040 05/25/16 0606  WBC 7.0 10.6* 14.8*  HGB 8.4* 8.8* 6.2*  HCT 25.9* 27.9* 18.6*  PLT 161 157 129*   BS: 161 @ 0600  Assessment & Plan:  Pt stable *Postpartum/postop: routine care *Heme: pt told what s/s of anemia are and told to let us know if she has any. SLIV *ID: temp just after delivery and could've been due to miso she received. Afebrile since and no s/s. Pt told to let us know if any s/s occur.  *GDMa2: follow BS values with am fasting and 2hr PP checks *Liver: last AST/ALT 62/91 on 5/22 and 4/5 HepB levels elevated. Will need GI follow up PP -Hep C neg, Hep A immune *Dispo: likely tomorrow  Phone interpreter used  O POS / Rubella Immune / Varicella Immune/  RPR negative / HIV negative  / Tdap UTD: ordered/ pap ascus/hpv neg 2017 / Breast  / Contraception: LARC (pt undecided on which kind) / Circ: not asked, infant still in NICU/ Follow up: [facility]  Durene Romans MD Attending Center for [place] [employer] farm manager)

## 2016-05-25 NOTE — Clinical Social Work Maternal (Signed)
CLINICAL SOCIAL WORK MATERNAL/CHILD NOTE  Patient Details  Name: [name] MRN: [id number] Date of Birth: 03/06/1991  Date: 05/25/2016  Clinical Social Worker Initiating Note: Nailah Luepke CarterDate/ Time Initiated: 05/25/16/1048   Child's Name: Unknown   Legal Guardian:  (Parents)   Need for Interpreter: Other (Comment Required) (Amarik: Interpreter ID [id number])   Date of Referral: 05/24/16   Reason for Referral: Parental Support of Premature Babies < 32 weeks/or Critically Ill babies  (Code APGAR; Respiratory distress)   Referral Source: NICU   Address: [address], [place]  Phone number: [phone]   Household Members: Self, Minor Children, Friends, Spouse   Natural Supports (not living in the home): Friends (Limited support in the community)   Professional Supports: (None reported)   Employment:Unemployed (Husband is employed)   Type of Work: n/a   Education:  (Unknown)   Financial Resources:Medicaid   Other Resources: WIC   Cultural/Religious Considerations Which May Impact Care: None reported by family; MOB is orginally from a country in [place].   Strengths: Compliance with medical plan , Home prepared for child , Ability to meet basic needs    Risk Factors/Current Problems: Basic Needs  (MOB reports limited financial resources for diapers and formula)   Cognitive State: Able to Concentrate , Alert , Linear Thinking    Mood/Affect: Calm , Comfortable    CSW Assessment:CSW met with MOB, FOB and their other minor child for assessment. CSW introduced self and explained that I was here for an assessment and would call the interpreting line. Parents were agreeable.   Once interpreter was accessed, CSW completed assessment. MOB reports that she is not experiencing any distress from the difficulty delivery. Per MOB, she lives with FOB, her minor child, and a friend. MOB and her husband moved  here from [place] approximately 2 months ago. She describes limited financial resources and is hopeful to receive some diapers and formula prior to returning home. No other needs expressed at this time. CSW encouraged MOB to use WIC benefits to purchase needed diapers and formula. MOB was agreeable but continues to request any help from the hospital that she can get.  MOB and FOB will return home at discharge.   CSW Plan/Description: Information/Referral to Community Resources     Carter, Elkin Belfield M, LCSW 05/25/2016, 10:59 AM 

## 2016-05-25 NOTE — Lactation Note (Signed)
This note was copied from a baby's chart. Lactation Consultation Note  Assisted mom with using symphony and hand expression. She had been discouraged related to smaller yield today than yesterday.  Encouraged mother to hand express first and then pump as it increased her yield. She expressed about 5 ml. Explained that colostrum was medicine for the baby. Understanding of all teaching verbalized. [employer] interpreter.  Amharic [id number] used.   Patient Name: [name] FAOZH'YToday's Date: 05/25/2016 Reason for consult: Follow-up assessment   Maternal Data    Feeding Feeding Type: Formula Nipple Type: Regular Length of feed: 5 min  LATCH Score/Interventions                      Lactation Tools Discussed/Used     Consult Status Consult Status: Follow-up Date: 05/26/16 Follow-up type: In-patient    Soyla DryerJoseph, Trexton Escamilla 05/25/2016, 12:13 PM

## 2016-05-26 LAB — GLUCOSE, CAPILLARY: GLUCOSE-CAPILLARY: 114 mg/dL — AB (ref 65–99)

## 2016-05-26 MED ORDER — IBUPROFEN 600 MG PO TABS: 600.0000 mg | ORAL_TABLET | Freq: Four times a day (QID) | ORAL | Status: DC | PRN

## 2016-05-26 MED ORDER — FERROUS SULFATE 325 (65 FE) MG PO TABS: 325.0000 mg | ORAL_TABLET | Freq: Two times a day (BID) | ORAL | Status: DC

## 2016-05-26 MED ORDER — METFORMIN HCL 500 MG PO TABS: 500.0000 mg | ORAL_TABLET | Freq: Two times a day (BID) | ORAL | Status: DC

## 2016-05-26 MED ORDER — OXYCODONE-ACETAMINOPHEN 5-325 MG PO TABS: 1.0000 | ORAL_TABLET | Freq: Four times a day (QID) | ORAL | Status: DC | PRN

## 2016-05-26 NOTE — Progress Notes (Signed)
Discharge instructions reviewed with patient per "[place]" interpreter per [facility].  Patient states understanding of home care, medications, activity, signs/symptoms to report to MD and return MD office visit.  Patients significant other and family will assist with her care @ home.  No home  equipment needed, patient has prescriptions and all personal belongings.  Patient ambulated for discharge in stable condition with staff without incident.

## 2016-05-26 NOTE — Discharge Summary (Signed)
OB Discharge Summary     Patient Name: [name] DOB: 1991/10/02 MRN: [id number]  Date of admission: 05/23/2016 Delivering MD: Manya Silvas   Date of discharge: 05/26/2016  Admitting diagnosis: INDUCTION Intrauterine pregnancy: [redacted]w[redacted]d    Secondary diagnosis:  Active Problems:   Gestational diabetes mellitus   Vaginal delivery   Postpartum hemorrhage, postpartum condition  Additional problems: Chronic hepatitis B, GDMA2, BMI 29     Discharge diagnosis: Term Pregnancy Delivered and Same. Asymptomatic anemia.                                                                                                Post partum procedures:none  Augmentation: cytotec, foley bulb, oxytocin  Complications: PPH [id number] manual extraction of placenta due to bleeding  Hospital course:  Patient asymptomatic from anemia so no transfusion was done. No e/o infection/endometritis. Blood sugars were still elevated with day before discharge ones 114, 141, 131, 161 for AM fasting and 2hr post prandials. She was transitioned from glyburide to metformin and these were her BS values on metformin. She was advised to stay on the metformin for one more week and then check her BS if they were still elevated to continue the metformin and call our clinic.   Physical exam  Filed Vitals:   05/25/16 1200 05/25/16 1725 05/25/16 2208 05/26/16 0547  BP: 115/62 101/63 116/79 93/60  Pulse: 88 104 97 95  Temp: 98 F (36.7 C) 97.8 F (36.6 C) 98.2 F (36.8 C) 98.3 F (36.8 C)  TempSrc: Oral Oral Oral Oral  Resp: 18 16 18 15   Height:      Weight:      SpO2: 100% 100% 100% 100%   NAD Normal s1 and s2, no MRGs CTAB FF below the umbilicus, nttp No CVAT No c/c/e  Labs: Lab Results  Component Value Date   WBC 14.8* 05/25/2016   HGB 6.2* 05/25/2016   HCT 18.6* 05/25/2016   MCV 71.8* 05/25/2016   PLT 129* 05/25/2016   CMP Latest Ref Rng 05/23/2016  Glucose 65 - 99 mg/dL 108(H)  Total Protein 6.1 - 8.1  g/dL -  Total Bilirubin 0.2 - 1.2 mg/dL -  Alkaline Phos 33 - 115 U/L -  AST 10 - 30 U/L -  ALT 6 - 29 U/L -    Discharge instruction: per After Visit Summary and "Baby and Me Booklet".  After visit meds:    Medication List    STOP taking these medications        glyBURIDE 2.5 MG tablet  Commonly known as:  DIABETA     metoCLOPramide 10 MG tablet  Commonly known as:  REGLAN     nitrofurantoin (macrocrystal-monohydrate) 100 MG capsule  Commonly known as:  MACROBID     pantoprazole 40 MG tablet  Commonly known as:  PROTONIX      TAKE these medications        ACCU-CHEK FASTCLIX LANCETS Misc  1 Device by Percutaneous route 4 (four) times daily.     ACCU-CHEK NANO SMARTVIEW w/Device Kit  1 kit by Does not apply route  once.     famotidine 20 MG tablet  Commonly known as:  PEPCID  Take 1 tablet (20 mg total) by mouth 2 (two) times daily.     ferrous sulfate 325 (65 FE) MG tablet  Take 1 tablet (325 mg total) by mouth 2 (two) times daily with a meal.     glucose blood test strip  Use as instructed     ibuprofen 600 MG tablet  Commonly known as:  ADVIL,MOTRIN  Take 1 tablet (600 mg total) by mouth every 6 (six) hours as needed.     metFORMIN 500 MG tablet  Commonly known as:  GLUCOPHAGE  Take 1 tablet (500 mg total) by mouth 2 (two) times daily with a meal.     oxyCODONE-acetaminophen 5-325 MG tablet  Commonly known as:  PERCOCET/ROXICET  Take 1 tablet by mouth every 6 (six) hours as needed for severe pain.     Prenatal Vitamins 0.8 MG tablet  Take 1 tablet by mouth daily.        Diet: carb modified diet  Activity: Advance as tolerated. Pelvic rest for 6 weeks.   Outpatient follow up:6 weeks Follow up Appt:Future Appointments Date Time Provider Charles Mix  07/04/2016 10:20 AM Woodroe Mode, MD [facility]   Follow up Visit:No Follow-up on file.  Postpartum contraception: ?LARC method  Newborn Data: Live born female  Birth Weight: 9 lb 1.7 oz  (4130 g) APGAR: 9, 9  Baby Feeding: Breast Disposition:home with mother. Outpatient circ   05/26/2016 Aletha Halim, MD

## 2016-05-26 NOTE — Discharge Instructions (Signed)
Keep taking the metformin for one week and then stop it but continue checking your blood sugars for one week. Let us know if the blood sugars are elevated after stopping the metformin    Vaginal Delivery, Care After Refer to this sheet in the next few weeks. These discharge instructions provide you with information on caring for yourself after delivery. Your caregiver may also give you specific instructions. Your treatment has been planned according to the most current medical practices available, but problems sometimes occur. Call your caregiver if you have any problems or questions after you go home. HOME CARE INSTRUCTIONS 1. Take over-the-counter or prescription medicines only as directed by your caregiver or pharmacist. 2. Do not drink alcohol, especially if you are breastfeeding or taking medicine to relieve pain. 3. Do not smoke tobacco. 4. Continue to use good perineal care. Good perineal care includes: 1. Wiping your perineum from back to front 2. Keeping your perineum clean. 3. You can do sitz baths twice a day, to help keep this area clean 5. Do not use tampons, douche or have sex until your caregiver says it is okay. 6. Shower only and avoid sitting in submerged water, aside from sitz baths 7. Wear a well-fitting bra that provides breast support. 8. Eat healthy foods. 9. Drink enough fluids to keep your urine clear or pale yellow. 10. Eat high-fiber foods such as whole grain cereals and breads, brown rice, beans, and fresh fruits and vegetables every day. These foods may help prevent or relieve constipation. 11. Avoid constipation with high fiber foods or medications, such as miralax or metamucil 12. Follow your caregiver's recommendations regarding resumption of activities such as climbing stairs, driving, lifting, exercising, or traveling. 13. Talk to your caregiver about resuming sexual activities. Resumption of sexual activities is dependent upon your risk of infection, your rate  of healing, and your comfort and desire to resume sexual activity. 14. Try to have someone help you with your household activities and your newborn for at least a few days after you leave the hospital. 15. Rest as much as possible. Try to rest or take a nap when your newborn is sleeping. 16. Increase your activities gradually. 17. Keep all of your scheduled postpartum appointments. It is very important to keep your scheduled follow-up appointments. At these appointments, your caregiver will be checking to make sure that you are healing physically and emotionally. SEEK MEDICAL CARE IF:   You are passing large clots from your vagina. Save any clots to show your caregiver.  You have a foul smelling discharge from your vagina.  You have trouble urinating.  You are urinating frequently.  You have pain when you urinate.  You have a change in your bowel movements.  You have increasing redness, pain, or swelling near your vaginal incision (episiotomy) or vaginal tear.  You have pus draining from your episiotomy or vaginal tear.  Your episiotomy or vaginal tear is separating.  You have painful, hard, or reddened breasts.  You have a severe headache.  You have blurred vision or see spots.  You feel sad or depressed.  You have thoughts of hurting yourself or your newborn.  You have questions about your care, the care of your newborn, or medicines.  You are dizzy or light-headed.  You have a rash.  You have nausea or vomiting.  You were breastfeeding and have not had a menstrual period within 12 weeks after you stopped breastfeeding.  You are not breastfeeding and have not had a  menstrual period by the 12th week after delivery.  You have a fever. SEEK IMMEDIATE MEDICAL CARE IF:   You have persistent pain.  You have chest pain.  You have shortness of breath.  You faint.  You have leg pain.  You have stomach pain.  Your vaginal bleeding saturates two or more sanitary  pads in 1 hour. MAKE SURE YOU:   Understand these instructions.  Will watch your condition.  Will get help right away if you are not doing well or get worse. Document Released: 11/22/2000 Document Revised: 04/11/2014 Document Reviewed: 07/22/2012 [facility] Patient Information 2015 [place], [place]. This information is not intended to replace advice given to you by your health care provider. Make sure you discuss any questions you have with your health care provider.  Sitz Bath A sitz bath is a warm water bath taken in the sitting position. The water covers only the hips and butt (buttocks). We recommend using one that fits in the toilet, to help with ease of use and cleanliness. It may be used for either healing or cleaning purposes. Sitz baths are also used to relieve pain, itching, or muscle tightening (spasms). The water may contain medicine. Moist heat will help you heal and relax.  HOME CARE  Take 3 to 4 sitz baths a day. 18. Fill the bathtub half-full with warm water. 19. Sit in the water and open the drain a little. 20. Turn on the warm water to keep the tub half-full. Keep the water running constantly. 21. Soak in the water for 15 to 20 minutes. 22. After the sitz bath, pat the affected area dry. GET HELP RIGHT AWAY IF: You get worse instead of better. Stop the sitz baths if you get worse. MAKE SURE YOU:  Understand these instructions.  Will watch your condition.  Will get help right away if you are not doing well or get worse. Document Released: 01/02/2005 Document Revised: 08/19/2012 Document Reviewed: 03/25/2011 [facility] Patient Information 2015 [place], [place]. This information is not intended to replace advice given to you by your health care provider. Make sure you discuss any questions you have with your health care provider.

## 2016-05-27 ENCOUNTER — Ambulatory Visit: Payer: Self-pay

## 2016-05-27 LAB — TYPE AND SCREEN
ABO/RH(D): O POS
Antibody Screen: NEGATIVE
UNIT DIVISION: 0
Unit division: 0

## 2016-05-27 NOTE — Lactation Note (Signed)
This note was copied from a baby's chart. Lactation Consultation Note  Patient Name: Boy Rosie FateBethelhem Mcvay ZOXWR'UToday's Date: 05/27/2016 Reason for consult: Follow-up assessment;NICU baby   Spoke with mother in NICU at bedside [facility] [employer] Gabe # [phone] prior to d/c home today. Mom had questions about WIC and if they would provide a pump and formula. Ad[facility] to [facility] and talk with them. Mom reports she breast and bottle fed her older son.   Enc mom to put infant to breast 8-12 x in 24 hours at first feeding cues and then offer formula supplement if needed. Mom reports her milk is coming in. Enc mom to call with questions/concerns. Infant with f/u [facility] appt Wed. Mom has a manual pump at home.    Maternal Data Formula Feeding for Exclusion: No Does the patient have breastfeeding experience prior to this delivery?: Yes  Feeding Feeding Type: Formula Nipple Type: Regular Length of feed: 15 min  LATCH Score/Interventions                      Lactation Tools Discussed/Used WIC Program: Yes   Consult Status Consult Status: Complete Follow-up type: Call as needed    Ed BlalockSharon S Cato Liburd 05/27/2016, 12:37 PM

## 2016-06-06 ENCOUNTER — Telehealth: Payer: Self-pay | Admitting: *Deleted

## 2016-06-06 DIAGNOSIS — O24419 Gestational diabetes mellitus in pregnancy, unspecified control: Secondary | ICD-10-CM

## 2016-06-06 MED ORDER — PRENATAL VITAMINS 0.8 MG PO TABS: 1.0000 | ORAL_TABLET | Freq: Every day | ORAL | Status: DC

## 2016-06-06 NOTE — Telephone Encounter (Signed)
[name], [facility] called on behalf of patient because she was concerned that patient was not taking her prenatal vitamins or iron. She spoke with patient about importance of continuing to take these and requested that we refill her prenatal vitamins as patient had thrown the old ones out. Patient to get her iron otc. Sent reorder for pnv to pharmacy of record.

## 2016-07-04 ENCOUNTER — Ambulatory Visit (INDEPENDENT_AMBULATORY_CARE_PROVIDER_SITE_OTHER): Payer: Medicaid Other | Admitting: Obstetrics & Gynecology

## 2016-07-04 ENCOUNTER — Encounter: Payer: Self-pay | Admitting: Obstetrics & Gynecology

## 2016-07-04 DIAGNOSIS — Z3009 Encounter for other general counseling and advice on contraception: Secondary | ICD-10-CM

## 2016-07-04 NOTE — Patient Instructions (Signed)
Etonogestrel implant What is this medicine? ETONOGESTREL (et oh noe JES trel) is a contraceptive (birth control) device. It is used to prevent pregnancy. It can be used for up to 3 years. This medicine may be used for other purposes; ask your health care provider or pharmacist if you have questions. What should I tell my health care provider before I take this medicine? They need to know if you have any of these conditions: -abnormal vaginal bleeding -blood vessel disease or blood clots -cancer of the breast, cervix, or liver -depression -diabetes -gallbladder disease -headaches -heart disease or recent heart attack -high blood pressure -high cholesterol -kidney disease -liver disease -renal disease -seizures -tobacco smoker -an unusual or allergic reaction to etonogestrel, other hormones, anesthetics or antiseptics, medicines, foods, dyes, or preservatives -pregnant or trying to get pregnant -breast-feeding How should I use this medicine? This device is inserted just under the skin on the inner side of your upper arm by a health care professional. Talk to your pediatrician regarding the use of this medicine in children. Special care may be needed. Overdosage: If you think you have taken too much of this medicine contact a poison control center or emergency room at once. NOTE: This medicine is only for you. Do not share this medicine with others. What if I miss a dose? This does not apply. What may interact with this medicine? Do not take this medicine with any of the following medications: -amprenavir -bosentan -fosamprenavir This medicine may also interact with the following medications: -barbiturate medicines for inducing sleep or treating seizures -certain medicines for fungal infections like ketoconazole and itraconazole -griseofulvin -medicines to treat seizures like carbamazepine, felbamate, oxcarbazepine, phenytoin,  topiramate -modafinil -phenylbutazone -rifampin -some medicines to treat HIV infection like atazanavir, indinavir, lopinavir, nelfinavir, tipranavir, ritonavir -St. John's wort This list may not describe all possible interactions. Give your health care provider a list of all the medicines, herbs, non-prescription drugs, or dietary supplements you use. Also tell them if you smoke, drink alcohol, or use illegal drugs. Some items may interact with your medicine. What should I watch for while using this medicine? This product does not protect you against HIV infection (AIDS) or other sexually transmitted diseases. You should be able to feel the implant by pressing your fingertips over the skin where it was inserted. Contact your doctor if you cannot feel the implant, and use a non-hormonal birth control method (such as condoms) until your doctor confirms that the implant is in place. If you feel that the implant may have broken or become bent while in your arm, contact your healthcare provider. What side effects may I notice from receiving this medicine? Side effects that you should report to your doctor or health care professional as soon as possible: -allergic reactions like skin rash, itching or hives, swelling of the face, lips, or tongue -breast lumps -changes in emotions or moods -depressed mood -heavy or prolonged menstrual bleeding -pain, irritation, swelling, or bruising at the insertion site -scar at site of insertion -signs of infection at the insertion site such as fever, and skin redness, pain or discharge -signs of pregnancy -signs and symptoms of a blood clot such as breathing problems; changes in vision; chest pain; severe, sudden headache; pain, swelling, warmth in the leg; trouble speaking; sudden numbness or weakness of the face, arm or leg -signs and symptoms of liver injury like dark yellow or brown urine; general ill feeling or flu-like symptoms; light-colored stools; loss of  appetite; nausea; right upper belly   pain; unusually weak or tired; yellowing of the eyes or skin -unusual vaginal bleeding, discharge -signs and symptoms of a stroke like changes in vision; confusion; trouble speaking or understanding; severe headaches; sudden numbness or weakness of the face, arm or leg; trouble walking; dizziness; loss of balance or coordination Side effects that usually do not require medical attention (Report these to your doctor or health care professional if they continue or are bothersome.): -acne -back pain -breast pain -changes in weight -dizziness -general ill feeling or flu-like symptoms -headache -irregular menstrual bleeding -nausea -sore throat -vaginal irritation or inflammation This list may not describe all possible side effects. Call your doctor for medical advice about side effects. You may report side effects to FDA at 1-800-FDA-1088. Where should I keep my medicine? This drug is given in a hospital or clinic and will not be stored at home. NOTE: This sheet is a summary. It may not cover all possible information. If you have questions about this medicine, talk to your doctor, pharmacist, or health care provider.    2016, Elsevier/Gold Standard. (2014-09-09 14:07:06) Intrauterine Device Information An intrauterine device (IUD) is inserted into your uterus to prevent pregnancy. There are two types of IUDs available:   Copper IUD--This type of IUD is wrapped in copper wire and is placed inside the uterus. Copper makes the uterus and fallopian tubes produce a fluid that kills sperm. The copper IUD can stay in place for 10 years.  Hormone IUD--This type of IUD contains the hormone progestin (synthetic progesterone). The hormone thickens the cervical mucus and prevents sperm from entering the uterus. It also thins the uterine lining to prevent implantation of a fertilized egg. The hormone can weaken or kill the sperm that get into the uterus. One type of  hormone IUD can stay in place for 5 years, and another type can stay in place for 3 years. Your health care provider will make sure you are a good candidate for a contraceptive IUD. Discuss with your health care provider the possible side effects.  ADVANTAGES OF AN INTRAUTERINE DEVICE  IUDs are highly effective, reversible, long acting, and low maintenance.   There are no estrogen-related side effects.   An IUD can be used when breastfeeding.   IUDs are not associated with weight gain.   The copper IUD works immediately after insertion.   The hormone IUD works right away if inserted within 7 days of your period starting. You will need to use a backup method of birth control for 7 days if the hormone IUD is inserted at any other time in your cycle.  The copper IUD does not interfere with your female hormones.   The hormone IUD can make heavy menstrual periods lighter and decrease cramping.   The hormone IUD can be used for 3 or 5 years.   The copper IUD can be used for 10 years. DISADVANTAGES OF AN INTRAUTERINE DEVICE  The hormone IUD can be associated with irregular bleeding patterns.   The copper IUD can make your menstrual flow heavier and more painful.   You may experience cramping and vaginal bleeding after insertion.    This information is not intended to replace advice given to you by your health care provider. Make sure you discuss any questions you have with your health care provider.   Document Released: 10/29/2004 Document Revised: 07/28/2013 Document Reviewed: 05/16/2013 Elsevier Interactive Patient Education 2016 [employer]. Contraception Choices Contraception (birth control) is the use of any methods or devices to prevent  pregnancy. Below are some methods to help avoid pregnancy. HORMONAL METHODS   Contraceptive implant. This is a thin, plastic tube containing progesterone hormone. It does not contain estrogen hormone. Your health care provider  inserts the tube in the inner part of the upper arm. The tube can remain in place for up to 3 years. After 3 years, the implant must be removed. The implant prevents the ovaries from releasing an egg (ovulation), thickens the cervical mucus to prevent sperm from entering the uterus, and thins the lining of the inside of the uterus.  Progesterone-only injections. These injections are given every 3 months by your health care provider to prevent pregnancy. This synthetic progesterone hormone stops the ovaries from releasing eggs. It also thickens cervical mucus and changes the uterine lining. This makes it harder for sperm to survive in the uterus.  Birth control pills. These pills contain estrogen and progesterone hormone. They work by preventing the ovaries from releasing eggs (ovulation). They also cause the cervical mucus to thicken, preventing the sperm from entering the uterus. Birth control pills are prescribed by a health care provider.Birth control pills can also be used to treat heavy periods.  Minipill. This type of birth control pill contains only the progesterone hormone. They are taken every day of each month and must be prescribed by your health care provider.  Birth control patch. The patch contains hormones similar to those in birth control pills. It must be changed once a week and is prescribed by a health care provider.  Vaginal ring. The ring contains hormones similar to those in birth control pills. It is left in the vagina for 3 weeks, removed for 1 week, and then a new one is put back in place. The patient must be comfortable inserting and removing the ring from the vagina.A health care provider's prescription is necessary.  Emergency contraception. Emergency contraceptives prevent pregnancy after unprotected sexual intercourse. This pill can be taken right after sex or up to 5 days after unprotected sex. It is most effective the sooner you take the pills after having sexual  intercourse. Most emergency contraceptive pills are available without a prescription. Check with your pharmacist. Do not use emergency contraception as your only form of birth control. BARRIER METHODS   Female condom. This is a thin sheath (latex or rubber) that is worn over the penis during sexual intercourse. It can be used with spermicide to increase effectiveness.  Female condom. This is a soft, loose-fitting sheath that is put into the vagina before sexual intercourse.  Diaphragm. This is a soft, latex, dome-shaped barrier that must be fitted by a health care provider. It is inserted into the vagina, along with a spermicidal jelly. It is inserted before intercourse. The diaphragm should be left in the vagina for 6 to 8 hours after intercourse.  Cervical cap. This is a round, soft, latex or plastic cup that fits over the cervix and must be fitted by a health care provider. The cap can be left in place for up to 48 hours after intercourse.  Sponge. This is a soft, circular piece of polyurethane foam. The sponge has spermicide in it. It is inserted into the vagina after wetting it and before sexual intercourse.  Spermicides. These are chemicals that kill or block sperm from entering the cervix and uterus. They come in the form of creams, jellies, suppositories, foam, or tablets. They do not require a prescription. They are inserted into the vagina with an applicator before having sexual intercourse.  The process must be repeated every time you have sexual intercourse. INTRAUTERINE CONTRACEPTION  Intrauterine device (IUD). This is a T-shaped device that is put in a woman's uterus during a menstrual period to prevent pregnancy. There are 2 types:  Copper IUD. This type of IUD is wrapped in copper wire and is placed inside the uterus. Copper makes the uterus and fallopian tubes produce a fluid that kills sperm. It can stay in place for 10 years.  Hormone IUD. This type of IUD contains the hormone  progestin (synthetic progesterone). The hormone thickens the cervical mucus and prevents sperm from entering the uterus, and it also thins the uterine lining to prevent implantation of a fertilized egg. The hormone can weaken or kill the sperm that get into the uterus. It can stay in place for 3-5 years, depending on which type of IUD is used. PERMANENT METHODS OF CONTRACEPTION  Female tubal ligation. This is when the woman's fallopian tubes are surgically sealed, tied, or blocked to prevent the egg from traveling to the uterus.  Hysteroscopic sterilization. This involves placing a small coil or insert into each fallopian tube. Your doctor uses a technique called hysteroscopy to do the procedure. The device causes scar tissue to form. This results in permanent blockage of the fallopian tubes, so the sperm cannot fertilize the egg. It takes about 3 months after the procedure for the tubes to become blocked. You must use another form of birth control for these 3 months.  Female sterilization. This is when the female has the tubes that carry sperm tied off (vasectomy).This blocks sperm from entering the vagina during sexual intercourse. After the procedure, the man can still ejaculate fluid (semen). NATURAL PLANNING METHODS  Natural family planning. This is not having sexual intercourse or using a barrier method (condom, diaphragm, cervical cap) on days the woman could become pregnant.  Calendar method. This is keeping track of the length of each menstrual cycle and identifying when you are fertile.  Ovulation method. This is avoiding sexual intercourse during ovulation.  Symptothermal method. This is avoiding sexual intercourse during ovulation, using a thermometer and ovulation symptoms.  Post-ovulation method. This is timing sexual intercourse after you have ovulated. Regardless of which type or method of contraception you choose, it is important that you use condoms to protect against the  transmission of sexually transmitted infections (STIs). Talk with your health care provider about which form of contraception is most appropriate for you.   This information is not intended to replace advice given to you by your health care provider. Make sure you discuss any questions you have with your health care provider.   Document Released: 11/25/2005 Document Revised: 11/30/2013 Document Reviewed: 05/20/2013 Elsevier Interactive Patient Education [employer]

## 2016-07-04 NOTE — Progress Notes (Signed)
Subjective:   Used [employer] Interpreter:[id number]  [name] is a 25 y.o. female who presents for a postpartum visit. She is 6 weeks postpartum following a spontaneous vaginal delivery. I have fully reviewed the prenatal and intrapartum course. The delivery was at 39 gestational weeks. Outcome: spontaneous vaginal delivery. Anesthesia: epidural. Postpartum course has been normal. Baby's course has been good. Baby is feeding by breast. Bleeding no bleeding. Bowel function is normal. Bladder function is normal. Patient is not sexually active. Contraception method is undecided. Postpartum depression screening: negative.  The following portions of the patient's history were reviewed and updated as appropriate: allergies, current medications, past family history, past medical history, past social history, past surgical history and problem list.  Review of Systems Pertinent items are noted in HPI.   Objective:    There were no vitals taken for this visit.  General:  alert, cooperative and no distress     Lungs: nl effort no distress  Heart:     Abdomen: soft, non-tender; bowel sounds normal; no masses,  no organomegaly   Vulva:  normal  Vagina: normal vagina and normal healing laceration  Cervix:     Corpus: not examined  Adnexa:  not evaluated  Rectal Exam: Not performed.        Assessment:     normal postpartum exam. Pap smear not done at today's visit.   Plan:    1. Contraception: information on LARC given and discussed 2. RTC 2 weeks and needs 2 hr GTT 3. Follow up in: 2 weeks or as needed.    Adam Phenix, MD 07/04/2016

## 2016-07-18 ENCOUNTER — Other Ambulatory Visit: Payer: Medicaid Other

## 2016-07-18 DIAGNOSIS — Z8632 Personal history of gestational diabetes: Secondary | ICD-10-CM

## 2016-07-19 LAB — GLUCOSE TOLERANCE, 2 HOURS
GLUCOSE, FASTING: 92 mg/dL (ref 65–99)
Glucose, 2 hour: 83 mg/dL (ref <140)

## 2016-07-26 ENCOUNTER — Encounter: Payer: Self-pay | Admitting: Obstetrics & Gynecology

## 2016-07-26 ENCOUNTER — Ambulatory Visit (INDEPENDENT_AMBULATORY_CARE_PROVIDER_SITE_OTHER): Payer: Medicaid Other | Admitting: Obstetrics & Gynecology

## 2016-07-26 VITALS — BP 106/67 | HR 66 | Wt 135.1 lb

## 2016-07-26 DIAGNOSIS — Z3202 Encounter for pregnancy test, result negative: Secondary | ICD-10-CM

## 2016-07-26 DIAGNOSIS — Z30013 Encounter for initial prescription of injectable contraceptive: Secondary | ICD-10-CM

## 2016-07-26 LAB — POCT PREGNANCY, URINE: PREG TEST UR: NEGATIVE

## 2016-07-26 MED ORDER — ETONOGESTREL 68 MG ~~LOC~~ IMPL
68.0000 mg | DRUG_IMPLANT | Freq: Once | SUBCUTANEOUS | Status: AC
  Administered 2016-07-26: 68 mg via SUBCUTANEOUS

## 2016-07-26 NOTE — Progress Notes (Signed)
   Subjective:    Patient ID: [name], female    DOB: 09/08/1991, 25 y.o.   MRN: [id number]  HPI 25 yo AA woman who is here for Nexplanon insertion. She is aware that irregular bleeding is not uncommon.   Review of Systems     Objective:   Physical Exam Consent was signed and time out was done. Her right arm was prepped with betadine after establishing the position of the Nexplanon. The area was infiltrated with 2 cc of 1% lidocaine. A small incision was made and the intact rod was easily removed. A steristrip was placed and her arm was noted to be hemostatic. It was bandaged.  She tolerated the procedure well.        Assessment & Plan:  Contraception- Nexplanon Rec back up for 2 weeks

## 2016-07-26 NOTE — Progress Notes (Signed)
[employer] interpreter [phone]#[id number] used

## 2016-07-26 NOTE — Addendum Note (Signed)
Addended by: Garret ReddishBARNES, Charna Neeb M on: 07/26/2016 10:28 AM   Modules accepted: Orders

## 2016-10-08 ENCOUNTER — Encounter: Payer: Self-pay | Admitting: Family Medicine

## 2016-10-08 ENCOUNTER — Ambulatory Visit: Payer: Medicaid Other | Attending: Family Medicine | Admitting: Family Medicine

## 2016-10-08 VITALS — BP 137/80 | HR 82 | Temp 98.5°F | Ht 65.0 in | Wt 143.2 lb

## 2016-10-08 DIAGNOSIS — E119 Type 2 diabetes mellitus without complications: Secondary | ICD-10-CM | POA: Insufficient documentation

## 2016-10-08 DIAGNOSIS — B181 Chronic viral hepatitis B without delta-agent: Secondary | ICD-10-CM

## 2016-10-08 DIAGNOSIS — Z23 Encounter for immunization: Secondary | ICD-10-CM | POA: Insufficient documentation

## 2016-10-08 DIAGNOSIS — Z7984 Long term (current) use of oral hypoglycemic drugs: Secondary | ICD-10-CM | POA: Insufficient documentation

## 2016-10-08 DIAGNOSIS — Z8632 Personal history of gestational diabetes: Secondary | ICD-10-CM | POA: Diagnosis not present

## 2016-10-08 LAB — GLUCOSE, POCT (MANUAL RESULT ENTRY): POC Glucose: 90 mg/dl (ref 70–99)

## 2016-10-08 LAB — POCT GLYCOSYLATED HEMOGLOBIN (HGB A1C): Hemoglobin A1C: 5.6

## 2016-10-08 NOTE — Patient Instructions (Signed)
Diabetes Mellitus and Food It is important for you to manage your blood sugar (glucose) level. Your blood glucose level can be greatly affected by what you eat. Eating healthier foods in the appropriate amounts throughout the day at about the same time each day will help you control your blood glucose level. It can also help slow or prevent worsening of your diabetes mellitus. Healthy eating may even help you improve the level of your blood pressure and reach or maintain a healthy weight.  General recommendations for healthful eating and cooking habits include:  Eating meals and snacks regularly. Avoid going long periods of time without eating to lose weight.  Eating a diet that consists mainly of plant-based foods, such as fruits, vegetables, nuts, legumes, and whole grains.  Using low-heat cooking methods, such as baking, instead of high-heat cooking methods, such as deep frying. Work with your dietitian to make sure you understand how to use the Nutrition Facts information on food labels. HOW CAN FOOD AFFECT ME? Carbohydrates Carbohydrates affect your blood glucose level more than any other type of food. Your dietitian will help you determine how many carbohydrates to eat at each meal and teach you how to count carbohydrates. Counting carbohydrates is important to keep your blood glucose at a healthy level, especially if you are using insulin or taking certain medicines for diabetes mellitus. Alcohol Alcohol can cause sudden decreases in blood glucose (hypoglycemia), especially if you use insulin or take certain medicines for diabetes mellitus. Hypoglycemia can be a life-threatening condition. Symptoms of hypoglycemia (sleepiness, dizziness, and disorientation) are similar to symptoms of having too much alcohol.  If your health care provider has given you approval to drink alcohol, do so in moderation and use the following guidelines:  Women should not have more than one drink per day, and men  should not have more than two drinks per day. One drink is equal to:  12 oz of beer.  5 oz of wine.  1 oz of hard liquor.  Do not drink on an empty stomach.  Keep yourself hydrated. Have water, diet soda, or unsweetened iced tea.  Regular soda, juice, and other mixers might contain a lot of carbohydrates and should be counted. WHAT FOODS ARE NOT RECOMMENDED? As you make food choices, it is important to remember that all foods are not the same. Some foods have fewer nutrients per serving than other foods, even though they might have the same number of calories or carbohydrates. It is difficult to get your body what it needs when you eat foods with fewer nutrients. Examples of foods that you should avoid that are high in calories and carbohydrates but low in nutrients include:  Trans fats (most processed foods list trans fats on the Nutrition Facts label).  Regular soda.  Juice.  Candy.  Sweets, such as cake, pie, doughnuts, and cookies.  Fried foods. WHAT FOODS CAN I EAT? Eat nutrient-rich foods, which will nourish your body and keep you healthy. The food you should eat also will depend on several factors, including:  The calories you need.  The medicines you take.  Your weight.  Your blood glucose level.  Your blood pressure level.  Your cholesterol level. You should eat a variety of foods, including:  Protein.  Lean cuts of meat.  Proteins low in saturated fats, such as fish, egg whites, and beans. Avoid processed meats.  Fruits and vegetables.  Fruits and vegetables that may help control blood glucose levels, such as apples, mangoes, and   yams.  Dairy products.  Choose fat-free or low-fat dairy products, such as milk, yogurt, and cheese.  Grains, bread, pasta, and rice.  Choose whole grain products, such as multigrain bread, whole oats, and brown rice. These foods may help control blood pressure.  Fats.  Foods containing healthful fats, such as nuts,  avocado, olive oil, canola oil, and fish. DOES EVERYONE WITH DIABETES MELLITUS HAVE THE SAME MEAL PLAN? Because every person with diabetes mellitus is different, there is not one meal plan that works for everyone. It is very important that you meet with a dietitian who will help you create a meal plan that is just right for you.   This information is not intended to replace advice given to you by your health care provider. Make sure you discuss any questions you have with your health care provider.   Document Released: 08/22/2005 Document Revised: 12/16/2014 Document Reviewed: 10/22/2013 Elsevier Interactive Patient Education 2016 Elsevier Inc.  

## 2016-10-08 NOTE — Progress Notes (Signed)
Subjective:  Patient ID: [name], female    DOB: 08/27/1991  Age: 25 y.o. MRN: [id number]  CC: Diabetes and Hepatitis B   HPI Lennyx Verdell presents to establish care. Medical history significant for gestational diabetes mellitus (A1c 5.6) for which she was previously on metformin during pregnancy but now does not take any medications. She was also diagnosed with hepatitis B during pregnancy and would like to be referred to a specialist.  Seen with the aid of an Arabic interpreter and has no other concerns at this time  Past Medical History:  Diagnosis Date  . Gestational diabetes   . Hepatitis B infection    +test in first pregancy    History reviewed. No pertinent surgical history.  No Known Allergies   Outpatient Medications Prior to Visit  Medication Sig Dispense Refill  . ACCU-CHEK FASTCLIX LANCETS MISC 1 Device by Percutaneous route 4 (four) times daily. 100 each 12  . Blood Glucose Monitoring Suppl (ACCU-CHEK NANO SMARTVIEW) w/Device KIT 1 kit by Does not apply route once. 1 kit 0  . glucose blood test strip Use as instructed 100 each 12  . ferrous sulfate 325 (65 FE) MG tablet Take 1 tablet (325 mg total) by mouth 2 (two) times daily with a meal. (Patient not taking: Reported on 10/08/2016) 60 tablet 1  . metFORMIN (GLUCOPHAGE) 500 MG tablet Take 1 tablet (500 mg total) by mouth 2 (two) times daily with a meal. (Patient not taking: Reported on 10/08/2016) 14 tablet 1  . Prenatal Multivit-Min-Fe-FA (PRENATAL VITAMINS) 0.8 MG tablet Take 1 tablet by mouth daily. (Patient not taking: Reported on 10/08/2016) 30 tablet 12   No facility-administered medications prior to visit.     ROS Review of Systems  Constitutional: Negative for activity change, appetite change and fatigue.  HENT: Negative for congestion, sinus pressure and sore throat.   Eyes: Negative for visual disturbance.  Respiratory: Negative for cough, chest tightness, shortness of breath and  wheezing.   Cardiovascular: Negative for chest pain and palpitations.  Gastrointestinal: Negative for abdominal distention, abdominal pain and constipation.  Endocrine: Negative for polydipsia.  Genitourinary: Negative for dysuria and frequency.  Musculoskeletal: Negative for arthralgias and back pain.  Skin: Negative for rash.  Neurological: Negative for tremors, light-headedness and numbness.  Hematological: Does not bruise/bleed easily.  Psychiatric/Behavioral: Negative for agitation and behavioral problems.    Objective:  BP 137/80 (BP Location: Right Arm, Patient Position: Sitting, Cuff Size: Small)   Pulse 82   Temp 98.5 F (36.9 C) (Oral)   Ht 5' 5"  (1.651 m)   Wt 143 lb 3.2 oz (65 kg)   SpO2 99%   BMI 23.83 kg/m   BP/Weight 10/08/2016 07/26/2016 5/45/6256  Systolic BP 389 373 428  Diastolic BP 80 67 63  Wt. (Lbs) 143.2 135.1 134.5  BMI 23.83 26.38 26.27     Physical Exam  Constitutional: She is oriented to person, place, and time. She appears well-developed and well-nourished.  Neck: Normal range of motion. No JVD present.  Cardiovascular: Normal rate, normal heart sounds and intact distal pulses.   No murmur heard. Pulmonary/Chest: Effort normal and breath sounds normal. She has no wheezes. She has no rales. She exhibits no tenderness.  Abdominal: Soft. Bowel sounds are normal. She exhibits no distension and no mass. There is no tenderness.  Musculoskeletal: Normal range of motion.  Neurological: She is alert and oriented to person, place, and time.  Skin: Skin is warm and dry.  Psychiatric: She has  a normal mood and affect.     Lab Results  Component Value Date   HGBA1C 5.6 10/08/2016    Assessment & Plan:   1. Type 2 diabetes mellitus without complication, without long-term current use of insulin (HCC) History of gestational diabetes Diet controlled with A1c of 5.6 Discussed ADA diet, my plate method Lipid panel at next visit Keep blood sugar logs  with fasting goals of 80-120 mg/dl, random of less than 180 and in the event of sugars less than 60 mg/dl or greater than 400 mg/dl please notify the clinic ASAP. It is recommended that you undergo annual eye exams and annual foot exams. Pneumovax is recommended every 5 years before the age of 45 and once for a lifetime at or after the age of 76. - Glucose (CBG) - HgB A1c - COMPLETE METABOLIC PANEL WITH GFR; Future - Lipid panel; Future - Microalbumin / creatinine urine ratio; Future  2. Hepatitis B, chronic ([place]) - Ambulatory referral to Infectious Disease  3. Encounter for immunization - Flu Vaccine QUAD 36+ mos IM   No orders of the defined types were placed in this encounter.   Follow-up: Return in about 3 months (around 01/08/2017) for Follow-up on diabetes mellitus.   Arnoldo Morale MD

## 2016-10-14 ENCOUNTER — Ambulatory Visit: Payer: Medicaid Other | Attending: Family Medicine

## 2016-10-14 DIAGNOSIS — E119 Type 2 diabetes mellitus without complications: Secondary | ICD-10-CM | POA: Diagnosis present

## 2016-10-14 LAB — COMPLETE METABOLIC PANEL WITH GFR
ALT: 20 U/L (ref 6–29)
AST: 20 U/L (ref 10–30)
Albumin: 4.5 g/dL (ref 3.6–5.1)
Alkaline Phosphatase: 61 U/L (ref 33–115)
BUN: 15 mg/dL (ref 7–25)
CALCIUM: 9.1 mg/dL (ref 8.6–10.2)
CHLORIDE: 105 mmol/L (ref 98–110)
CO2: 23 mmol/L (ref 20–31)
Creat: 0.65 mg/dL (ref 0.50–1.10)
GFR, Est African American: >89 mL/min (ref >=60)
GFR, Est Non African American: >89 mL/min (ref >=60)
GLUCOSE: 99 mg/dL (ref 65–99)
POTASSIUM: 3.9 mmol/L (ref 3.5–5.3)
SODIUM: 138 mmol/L (ref 135–146)
Total Bilirubin: 0.8 mg/dL (ref 0.2–1.2)
Total Protein: 7.9 g/dL (ref 6.1–8.1)

## 2016-10-14 LAB — LIPID PANEL
CHOL/HDL RATIO: 2.9 ratio (ref <5.0)
CHOLESTEROL: 150 mg/dL (ref <200)
HDL: 52 mg/dL (ref >50)
LDL Cholesterol: 92 mg/dL
Triglycerides: 32 mg/dL (ref <150)
VLDL: 6 mg/dL (ref <30)

## 2016-10-14 NOTE — Progress Notes (Signed)
Patient here for lab visit only 

## 2016-10-15 LAB — MICROALBUMIN / CREATININE URINE RATIO
Creatinine, Urine: 223 mg/dL (ref 20–320)
MICROALB UR: 3.5 mg/dL
Microalb Creat Ratio: 16 mcg/mg creat (ref <30)

## 2016-11-14 NOTE — Progress Notes (Signed)
Writer was unable to contact patient by phone to discuss her lab results.  A letter was mailed to patient today with those results.

## 2017-01-20 ENCOUNTER — Ambulatory Visit: Payer: Medicaid Other | Attending: Family Medicine | Admitting: Family Medicine

## 2017-01-20 ENCOUNTER — Encounter: Payer: Self-pay | Admitting: Family Medicine

## 2017-01-20 VITALS — BP 106/68 | HR 69 | Temp 98.3°F | Ht 61.0 in | Wt 139.8 lb

## 2017-01-20 DIAGNOSIS — Z8632 Personal history of gestational diabetes: Secondary | ICD-10-CM | POA: Insufficient documentation

## 2017-01-20 DIAGNOSIS — B181 Chronic viral hepatitis B without delta-agent: Secondary | ICD-10-CM | POA: Insufficient documentation

## 2017-01-20 DIAGNOSIS — Z5189 Encounter for other specified aftercare: Secondary | ICD-10-CM | POA: Insufficient documentation

## 2017-01-20 DIAGNOSIS — R140 Abdominal distension (gaseous): Secondary | ICD-10-CM | POA: Diagnosis not present

## 2017-01-20 LAB — CBC WITH DIFFERENTIAL/PLATELET
BASOS PCT: 0 %
Basophils Absolute: 0 cells/uL (ref 0–200)
EOS ABS: 91 {cells}/uL (ref 15–500)
Eosinophils Relative: 1 %
HCT: 39.7 % (ref 35.0–45.0)
Hemoglobin: 13.6 g/dL (ref 11.7–15.5)
LYMPHS PCT: 26 %
Lymphs Abs: 2366 cells/uL (ref 850–3900)
MCH: 30.3 pg (ref 27.0–33.0)
MCHC: 34.3 g/dL (ref 32.0–36.0)
MCV: 88.4 fL (ref 80.0–100.0)
MONOS PCT: 8 %
MPV: 9.8 fL (ref 7.5–12.5)
Monocytes Absolute: 728 cells/uL (ref 200–950)
Neutro Abs: 5915 cells/uL (ref 1500–7800)
Neutrophils Relative %: 65 %
PLATELETS: 261 10*3/uL (ref 140–400)
RBC: 4.49 MIL/uL (ref 3.80–5.10)
RDW: 13.2 % (ref 11.0–15.0)
WBC: 9.1 10*3/uL (ref 3.8–10.8)

## 2017-01-20 LAB — COMPLETE METABOLIC PANEL WITH GFR
ALT: 12 U/L (ref 6–29)
AST: 15 U/L (ref 10–30)
Albumin: 4.7 g/dL (ref 3.6–5.1)
Alkaline Phosphatase: 75 U/L (ref 33–115)
BILIRUBIN TOTAL: 0.5 mg/dL (ref 0.2–1.2)
BUN: 17 mg/dL (ref 7–25)
CALCIUM: 9.2 mg/dL (ref 8.6–10.2)
CO2: 24 mmol/L (ref 20–31)
CREATININE: 0.6 mg/dL (ref 0.50–1.10)
Chloride: 105 mmol/L (ref 98–110)
GFR, Est African American: >89 mL/min (ref >=60)
GFR, Est Non African American: >89 mL/min (ref >=60)
Glucose, Bld: 73 mg/dL (ref 65–99)
Potassium: 4.1 mmol/L (ref 3.5–5.3)
Sodium: 138 mmol/L (ref 135–146)
TOTAL PROTEIN: 8.1 g/dL (ref 6.1–8.1)

## 2017-01-20 LAB — POCT GLYCOSYLATED HEMOGLOBIN (HGB A1C): HEMOGLOBIN A1C: 5.4

## 2017-01-20 LAB — GLUCOSE, POCT (MANUAL RESULT ENTRY): POC Glucose: 80 mg/dl (ref 70–99)

## 2017-01-20 MED ORDER — OMEPRAZOLE 20 MG PO CPDR: 20.0000 mg | DELAYED_RELEASE_CAPSULE | Freq: Every day | ORAL | 3 refills | Status: DC

## 2017-01-20 NOTE — Patient Instructions (Signed)
Hepatitis B Hepatitis B is a viral infection of the liver. There are two kinds of hepatitis B:  Acute hepatitis B. Hepatitis B that lasts six months or less is called acute hepatitis B.  Chronic hepatitis B. Hepatitis B that lasts more than six months is called long-term (chronic) hepatitis B. Chronic hepatitis B can lead to liver failure, scarring of the liver (cirrhosis), or liver cancer. Acute hepatitis B can turn into chronic hepatitis B. Most adults with acute hepatitis B do not develop chronic hepatitis B. Infants and young children who get hepatitis B are more likely to develop chronic hepatitis B than adults who get hepatitis B. What are the causes? Hepatitis B is caused by the hepatitis B virus (HBV). The virus is passed from one person to another through blood, birth, sex, or bodily fluids, such as:  Breast milk.  Tears.  Saliva. What increases the risk? The risk factors for hepatitis B include:  Having unprotected sex with someone who is infected.  Using drugs with needles. What are the signs or symptoms? Symptoms of hepatitis B may include:  Loss of appetite.  Fatigue.  Nausea.  Vomiting.  Stomach pain.  Dark yellow urine.  Yellowish skin and eyes (jaundice). Hepatitis B does not always cause symptoms. How is this diagnosed? Your health care provider will do a blood test to diagnose hepatitis B. How is this treated? You will need to prevent further injury to the liver by avoiding alcohol and medicines that can be hard for the liver to metabolize. Treatment for chronic hepatitis B may include antiviral medicine. This medicine may help:  Lower your risk of liver failure.  Lower your ability to infect others with hepatitis B.  Lower your risk of scarring your liver (cirrhosis).  Lower your risk of liver cancer. Follow these instructions at home:  Rest as needed.  Avoid alcohol.  Take medicines only as directed by your health care provider.  Do not  take any medicine without approval from your health care provider. This includes over-the-counter medicine that is usually taken for fever or pain.  Do not have sex unless approved by your health care provider.  Do not share toothbrushes, nail clippers, razors, or needles with others. Get help right away if:  You are unable to eat or drink.  You have a fever along with nausea or vomiting.  You feel confused.  You develop jaundice or your chronic jaundice becomes more severe.  You have trouble breathing.  You develop a rash.  Your skin, throat, mouth, or face becomes swollen.  Your body shakes or twitches (seizure).  You become very sleepy or have trouble waking up. This information is not intended to replace advice given to you by your health care provider. Make sure you discuss any questions you have with your health care provider. Document Released: 11/22/2000 Document Revised: 05/02/2016 Document Reviewed: 03/04/2014 Elsevier Interactive Patient Education  2017 [employer]

## 2017-01-20 NOTE — Progress Notes (Signed)
Subjective:  Patient ID: [name], female    DOB: Dec 07, 1991  Age: 26 y.o. MRN: [id number]  CC: Follow-up and Diabetes   HPI Maicy Filip is a 26 year old female with history of gestational diabetes mellitus (A1c 5.4), hepatitis B who presents today for follow-up visit.  She is concerned about hepatitis B infection which was diagnosed in pregnancy; I had referred her to infectious disease 3 months ago however she is yet to hear from them. She denies jaundice, abdominal pain or fever but does have some occasional abdominal bloating and nausea. Denies constipation, diarrhea.  Past Medical History:  Diagnosis Date  . Gestational diabetes   . Hepatitis B infection    +test in first pregancy    History reviewed. No pertinent surgical history.  No Known Allergies   Outpatient Medications Prior to Visit  Medication Sig Dispense Refill  . ACCU-CHEK FASTCLIX LANCETS MISC 1 Device by Percutaneous route 4 (four) times daily. (Patient not taking: Reported on 01/20/2017) 100 each 12  . Blood Glucose Monitoring Suppl (ACCU-CHEK NANO SMARTVIEW) w/Device KIT 1 kit by Does not apply route once. (Patient not taking: Reported on 01/20/2017) 1 kit 0  . glucose blood test strip Use as instructed (Patient not taking: Reported on 01/20/2017) 100 each 12   No facility-administered medications prior to visit.     ROS Review of Systems  Constitutional: Negative for activity change, appetite change and fatigue.  HENT: Negative for congestion, sinus pressure and sore throat.   Eyes: Negative for visual disturbance.  Respiratory: Negative for cough, chest tightness, shortness of breath and wheezing.   Cardiovascular: Negative for chest pain and palpitations.  Gastrointestinal: Negative for abdominal distention, abdominal pain and constipation.  Endocrine: Negative for polydipsia.  Genitourinary: Negative for dysuria and frequency.  Musculoskeletal: Negative for arthralgias and back pain.    Skin: Negative for rash.  Neurological: Negative for tremors, light-headedness and numbness.  Hematological: Does not bruise/bleed easily.  Psychiatric/Behavioral: Negative for agitation and behavioral problems.    Objective:  BP 106/68 (BP Location: Right Arm, Patient Position: Sitting, Cuff Size: Small)   Pulse 69   Temp 98.3 F (36.8 C) (Oral)   Ht 5' 1"  (1.549 m)   Wt 139 lb 12.8 oz (63.4 kg)   SpO2 99%   BMI 26.41 kg/m   BP/Weight 01/20/2017 10/08/2016 07/03/3663  Systolic BP 403 474 259  Diastolic BP 68 80 67  Wt. (Lbs) 139.8 143.2 135.1  BMI 26.41 23.83 26.38      Physical Exam  Constitutional: She is oriented to person, place, and time. She appears well-developed and well-nourished.  Cardiovascular: Normal rate, normal heart sounds and intact distal pulses.   No murmur heard. Pulmonary/Chest: Effort normal and breath sounds normal. She has no wheezes. She has no rales. She exhibits no tenderness.  Abdominal: Soft. Bowel sounds are normal. She exhibits no distension and no mass. There is no tenderness.  Musculoskeletal: Normal range of motion.  Neurological: She is alert and oriented to person, place, and time.  Skin: Skin is warm and dry.  Psychiatric: She has a normal mood and affect.     Lab Results  Component Value Date   HGBA1C 5.4 01/20/2017    CMP Latest Ref Rng & Units 10/14/2016 05/23/2016 04/29/2016  Glucose 65 - 99 mg/dL 99 108(H) -  BUN 7 - 25 mg/dL 15 - -  Creatinine 0.50 - 1.10 mg/dL 0.65 - -  Sodium 135 - 146 mmol/L 138 - -  Potassium 3.5 -  5.3 mmol/L 3.9 - -  Chloride 98 - 110 mmol/L 105 - -  CO2 20 - 31 mmol/L 23 - -  Calcium 8.6 - 10.2 mg/dL 9.1 - -  Total Protein 6.1 - 8.1 g/dL 7.9 - 5.7(L)  Total Bilirubin 0.2 - 1.2 mg/dL 0.8 - 0.4  Alkaline Phos 33 - 115 U/L 61 - 178(H)  AST 10 - 30 U/L 20 - 62(H)  ALT 6 - 29 U/L 20 - 91(H)    Lipid Panel     Component Value Date/Time   CHOL 150 10/14/2016 0912   TRIG 32 10/14/2016 0912   HDL  52 10/14/2016 0912   CHOLHDL 2.9 10/14/2016 0912   VLDL 6 10/14/2016 0912   LDLCALC 92 10/14/2016 0912     Assessment & Plan:   1. History of gestational diabetes A1c of 5.4 Continue lifestyle modification - Glucose (CBG) - HgB A1c - COMPLETE METABOLIC PANEL WITH GFR - CBC with Differential/Platelet  2. Hepatitis B, chronic (HCC) Asymptomatic - Hepatitis B E Antibody - Ambulatory referral to Infectious Disease  3. Abdominal bloating We'll place on PPI presumptively  No orders of the defined types were placed in this encounter.   Follow-up: Return in about 6 months (around 07/20/2017) for Coordination of care.   Arnoldo Morale MD

## 2017-01-22 LAB — HEPATITIS B E ANTIBODY: Hepatitis Be Antibody: REACTIVE — AB

## 2017-04-07 ENCOUNTER — Ambulatory Visit (INDEPENDENT_AMBULATORY_CARE_PROVIDER_SITE_OTHER): Payer: BLUE CROSS/BLUE SHIELD | Admitting: Internal Medicine

## 2017-04-07 ENCOUNTER — Encounter: Payer: Self-pay | Admitting: Internal Medicine

## 2017-04-07 ENCOUNTER — Telehealth: Payer: Self-pay | Admitting: *Deleted

## 2017-04-07 VITALS — BP 110/72 | HR 67 | Temp 98.0°F | Ht 61.0 in | Wt 139.0 lb

## 2017-04-07 DIAGNOSIS — B181 Chronic viral hepatitis B without delta-agent: Secondary | ICD-10-CM | POA: Diagnosis not present

## 2017-04-07 LAB — COMPREHENSIVE METABOLIC PANEL
AG Ratio: 1.3 Ratio (ref 1.0–2.5)
ALT: 16 U/L (ref 6–29)
AST: 14 U/L (ref 10–30)
Albumin: 4.4 g/dL (ref 3.6–5.1)
Alkaline Phosphatase: 78 U/L (ref 33–115)
BUN/Creatinine Ratio: 16.9 Ratio (ref 6–22)
BUN: 10 mg/dL (ref 7–25)
CHLORIDE: 105 mmol/L (ref 98–110)
CO2: 23 mmol/L (ref 20–31)
Calcium: 8.8 mg/dL (ref 8.6–10.2)
Creat: 0.59 mg/dL (ref 0.50–1.10)
Globulin: 3.3 g/dL (ref 1.9–3.7)
Glucose, Bld: 94 mg/dL (ref 65–99)
Potassium: 3.9 mmol/L (ref 3.5–5.3)
SODIUM: 138 mmol/L (ref 135–146)
Total Bilirubin: 0.5 mg/dL (ref 0.2–1.2)
Total Protein: 7.7 g/dL (ref 6.1–8.1)

## 2017-04-07 LAB — COMPLETE METABOLIC PANEL WITH GFR

## 2017-04-07 LAB — CMP 10231
AG RATIO: 1.3 ratio (ref 1.0–2.5)
ALK PHOS: 78 U/L (ref 33–115)
ALT: 16 U/L (ref 6–29)
AST: 14 U/L (ref 10–30)
Albumin: 4.4 g/dL (ref 3.6–5.1)
BILIRUBIN TOTAL: 0.5 mg/dL (ref 0.2–1.2)
BUN/Creatinine Ratio: 16.9 Ratio (ref 6–22)
BUN: 10 mg/dL (ref 7–25)
CO2: 23 mmol/L (ref 20–31)
Calcium: 8.8 mg/dL (ref 8.6–10.2)
Chloride: 105 mmol/L (ref 98–110)
Creat: 0.59 mg/dL (ref 0.50–1.10)
GFR, Est African American: >89 mL/min (ref >=60)
GFR, Est Non African American: >89 mL/min (ref >=60)
GLOBULIN: 3.3 g/dL (ref 1.9–3.7)
Glucose, Bld: 94 mg/dL (ref 65–99)
POTASSIUM: 3.9 mmol/L (ref 3.5–5.3)
Sodium: 138 mmol/L (ref 135–146)
TOTAL PROTEIN: 7.7 g/dL (ref 6.1–8.1)

## 2017-04-07 LAB — CBC
HEMATOCRIT: 39.5 % (ref 35.0–45.0)
HEMOGLOBIN: 13 g/dL (ref 11.7–15.5)
MCH: 29.5 pg (ref 27.0–33.0)
MCHC: 32.9 g/dL (ref 32.0–36.0)
MCV: 89.8 fL (ref 80.0–100.0)
MPV: 9.3 fL (ref 7.5–12.5)
PLATELETS: 306 10*3/uL (ref 140–400)
RBC: 4.4 MIL/uL (ref 3.80–5.10)
RDW: 13.9 % (ref 11.0–15.0)
WBC: 8.6 10*3/uL (ref 3.8–10.8)

## 2017-04-07 NOTE — Telephone Encounter (Signed)
Patient notified of appointment for ultrasound through [facility] [employer] ID [phone] on 04/21/17 at 8:45 AM at [facility] Radiology, 1rst floor. Patient was given address and advised nothing to eat or drink after midnight the night before. Authorization # [id number] good through 05/07/17. Wendall Mola CMA

## 2017-04-07 NOTE — Progress Notes (Signed)
[facility] for Infectious Disease      Reason for Consult: chronic hepatitis B    Referring Physician: Dr. Jarold Song    Patient ID: [name], female    DOB: [birth year], 26 y.o.   MRN: [id number]  HPI:   She was previously diagnosed with chronic hepatitis B when she delivered her baby in [place].  She was told at the time that her viral load was low so did not need treatment and she remembers him getting shots at the time for hepatitis B, though is unsure exactly what it was.  She does though remember him getting follow up blood test at 1 year and he was negative for hepatitis B. She does not recall any episodes of jaundice in her life.  She does remember her sister was positive for hepatitis B and doing well, no treatment indicated.  No known liver disease in her family. No history of liver cancer.   No weight loss, no associated n/v/d.  No associated arthralgias.    Past Medical History:  Diagnosis Date  . Gestational diabetes   . Hepatitis B infection    +test in first pregancy    Prior to Admission medications   Medication Sig Start Date End Date Taking? Authorizing Provider  omeprazole (PRILOSEC) 20 MG capsule Take 1 capsule (20 mg total) by mouth daily. 01/20/17  Yes Arnoldo Morale, MD  ACCU-CHEK FASTCLIX LANCETS MISC 1 Device by Percutaneous route 4 (four) times daily. Patient not taking: Reported on 01/20/2017 04/15/16   Woodroe Mode, MD  Blood Glucose Monitoring Suppl (ACCU-CHEK NANO SMARTVIEW) w/Device KIT 1 kit by Does not apply route once. Patient not taking: Reported on 01/20/2017 04/22/16   Mora Bellman, MD  glucose blood test strip Use as instructed Patient not taking: Reported on 01/20/2017 04/15/16   Woodroe Mode, MD    No Known Allergies  Social History  Substance Use Topics  . Smoking status: Never Smoker  . Smokeless tobacco: Never Used  . Alcohol use No    FMH: no liver cancer, no cirrhosis  Review of Systems  Constitutional: negative for fatigue,  malaise and anorexia Gastrointestinal: negative for diarrhea Integument/breast: negative for rash Musculoskeletal: negative for myalgias and arthralgias All other systems reviewed and are negative    Constitutional: in no apparent distress and alert  Vitals:   04/07/17 0857  BP: 110/72  Pulse: 67  Temp: 98 F (36.7 C)   EYES: anicteric ENMT: Cardiovascular: Cor RRR Respiratory: CTA B; normal respiratory effort GI: Bowel sounds are normal, liver is not enlarged, spleen is not enlarged, soft, nt Musculoskeletal: no pedal edema noted Skin: negatives: no rash Hematologic: no cervical lad  Labs: Lab Results  Component Value Date   WBC 9.1 01/20/2017   HGB 13.6 01/20/2017   HCT 39.7 01/20/2017   MCV 88.4 01/20/2017   PLT 261 01/20/2017    Lab Results  Component Value Date   CREATININE 0.60 01/20/2017   BUN 17 01/20/2017   NA 138 01/20/2017   K 4.1 01/20/2017   CL 105 01/20/2017   CO2 24 01/20/2017    Lab Results  Component Value Date   ALT 12 01/20/2017   AST 15 01/20/2017   ALKPHOS 75 01/20/2017   BILITOT 0.5 01/20/2017   INR 1.06 04/29/2016     Assessment: chronic hepatitis B.  I discussed with her the nature of hepatitis B, transmission and ways to prevent it.  I discussed with her that typically it is acquired  perinatally and has a much higher chance of developing into chronic disease the younger the person is when acquired.  I discussed the risk of liver cancer and age risk.  I discussed with her treatment options, lack of a cure and indications for treatment.    Plan: 1) ultrasound with elastography for baseline 2) hepatitis B labs 3) check hepatitis A immunity, hepatitis C 4) HIV negative rtc in 6 months for repeat labs unless she now has significant inflammation

## 2017-04-08 LAB — HEPATITIS A ANTIBODY, TOTAL: Hep A Total Ab: REACTIVE — AB

## 2017-04-08 LAB — HEPATITIS B CORE ANTIBODY, TOTAL: Hep B Core Total Ab: REACTIVE — AB

## 2017-04-08 LAB — HEPATITIS B SURF AG CONFIRMATION: Hepatitis B Surf Ag Confirmation: POSITIVE — AB

## 2017-04-08 LAB — HEPATITIS B SURFACE ANTIGEN: HEP B S AG: POSITIVE — AB

## 2017-04-08 LAB — HEPATITIS B SURFACE ANTIBODY,QUALITATIVE: HEP B S AB: NEGATIVE

## 2017-04-08 LAB — HEPATITIS C ANTIBODY: HCV AB: NEGATIVE

## 2017-04-09 LAB — HEPATITIS B DNA, ULTRAQUANTITATIVE, PCR
HEPATITIS B DNA: 46 [IU]/mL — AB
Hepatitis B DNA (Calc): 1.66 Log IU/mL — ABNORMAL HIGH

## 2017-04-09 LAB — HEPATITIS B E ANTIBODY: Hepatitis Be Antibody: REACTIVE — AB

## 2017-04-09 LAB — HEPATITIS B E ANTIGEN: HEPATITIS BE ANTIGEN: NONREACTIVE

## 2017-04-21 ENCOUNTER — Ambulatory Visit (HOSPITAL_COMMUNITY)
Admission: RE | Admit: 2017-04-21 | Discharge: 2017-04-21 | Disposition: A | Discharge status: Home / Self Care | Payer: BLUE CROSS/BLUE SHIELD | Source: Ambulatory Visit | Attending: Internal Medicine | Admitting: Internal Medicine

## 2017-04-21 DIAGNOSIS — B181 Chronic viral hepatitis B without delta-agent: Secondary | ICD-10-CM | POA: Insufficient documentation

## 2017-09-09 ENCOUNTER — Ambulatory Visit: Payer: Medicaid Other | Admitting: Internal Medicine

## 2017-09-09 ENCOUNTER — Ambulatory Visit: Payer: Self-pay | Admitting: Internal Medicine

## 2018-02-25 NOTE — Congregational Nurse Program (Signed)
Congregational Nurse Program Note  Date of Encounter: 02/25/2018  Past Medical History: Past Medical History:  Diagnosis Date  . Gestational diabetes   . Hepatitis B infection    +test in first pregancy    Encounter Details: CNP Questionnaire - 02/25/18 1030      Questionnaire   Patient Status  Refugee    Race  African    Location Patient Served At  [facility]    Insurance  Not Applicable    Uninsured  Uninsured (NEW 1x/quarter)    Food  No food insecurities    Housing/Utilities  No permanent housing    Transportation  No transportation needs    Interpersonal Safety  Yes, feel physically and emotionally safe where you currently live    Medication  No medication insecurities    Medical Provider  No    Referrals  [employer], [employer];Other    ED Visit Averted  Not Applicable    Life-Saving Intervention Made  Not Applicable      Office visit for this Amharic speaking lady with limited English, but able to express concerns initially. Healthy looking lady. Complains of "heavy Bleeding" since three weeks. Using almost twice the amount of pads for protection.  History of [facility] in [facility]. Experienced ammennorrhea since two years until recent menorrhagia and left lower hip and abdominal pain prior to onset of bleeding. Pain subsided once menses started. Last live  birth three years ago. Currently uninsured and [facility]. Plan: Increase hydration.  [facility] for eligibility screening and appointment 04--03-19 at 8:15 am. Referred to [employer] social worker to apply for [employer] health coverage also.  Follow-up prn. Ferol LuzMarietta Kush [place], RN/CN

## 2018-03-17 NOTE — Congregational Nurse Program (Signed)
Congregational Nurse Program Note  Date of Encounter: 03/17/2018  Past Medical History: Past Medical History:  Diagnosis Date  . Gestational diabetes   . Hepatitis B infection    +test in first pregancy    Encounter Details: CNP Questionnaire - 03/17/18 1552      Questionnaire   Referrals  Not Applicable      Return office visit to follow-up [facility] and with complaint of a headache this am. Acknowledged "fasting" last 24 hours and daily for rest of week, eating one meal daily for religious purpose. [facility] for irregular menses and concerns about [facility]. Evaluated and received preventive health information, recommended multiple vitamin/Folic acid and exercise. Menorrhagia subsided. Advised to follow-up lab tests ( HIV);wet prep negative. Return appointment one year. Increase water intake. Caution during fasting and low B/P 99/62;Glucose  96. Return to nurse office at [facility] prn.  Ferol LuzMarietta Kayston Jodoin, RN

## 2018-04-13 ENCOUNTER — Ambulatory Visit: Payer: Medicaid Other | Attending: Family Medicine | Admitting: Family Medicine

## 2018-04-13 ENCOUNTER — Encounter: Payer: Self-pay | Admitting: Family Medicine

## 2018-04-13 VITALS — BP 96/63 | HR 78 | Temp 97.8°F | Wt 137.2 lb

## 2018-04-13 DIAGNOSIS — Z79899 Other long term (current) drug therapy: Secondary | ICD-10-CM | POA: Insufficient documentation

## 2018-04-13 DIAGNOSIS — R140 Abdominal distension (gaseous): Secondary | ICD-10-CM | POA: Insufficient documentation

## 2018-04-13 DIAGNOSIS — Z8632 Personal history of gestational diabetes: Secondary | ICD-10-CM

## 2018-04-13 DIAGNOSIS — R109 Unspecified abdominal pain: Secondary | ICD-10-CM | POA: Insufficient documentation

## 2018-04-13 LAB — POCT GLYCOSYLATED HEMOGLOBIN (HGB A1C): Hemoglobin A1C: 5.3

## 2018-04-13 MED ORDER — CYCLOBENZAPRINE HCL 10 MG PO TABS: 10.0000 mg | ORAL_TABLET | Freq: Two times a day (BID) | ORAL | 1 refills | Status: DC | PRN

## 2018-04-13 MED FILL — CYCLOBENZAPRINE 10 MG TAB: 10 | 15 days supply | Qty: 30 | Fill #0

## 2018-04-13 NOTE — Progress Notes (Signed)
Subjective:  Patient ID: [name], female    DOB: November 12, 1991  Age: 27 y.o. MRN: [id number]  CC: Establish Care   HPI [name] is a 27 year old female with a history of Gestational Diabetes (A1c 5.3 from today), Hepatitis B infection here tor a follow up visit. She complains of abdominal bloating and intermittent pain for the last 4-5 months but no nausea, vomiting, diarrhea, constipation. She also has had left flank pain with prolonged sitting or performing house chores but denies urinary symptoms. Pain is rated as a 4/10 and she has no preceding history of trauma or heavy lifting. Last seen by infectious disease for Hepatitis B in 03/2017 and labs revealed positive Hepatitis BSAg, BeAg, B Core Ab, neg Hep C, neg HIV and she was supposed to follow up in 6 months which she never did.  Past Medical History:  Diagnosis Date  . Gestational diabetes   . Hepatitis B infection    +test in first pregancy    No past surgical history on file.  No Known Allergies   Outpatient Medications Prior to Visit  Medication Sig Dispense Refill  . ACCU-CHEK FASTCLIX LANCETS MISC 1 Device by Percutaneous route 4 (four) times daily. (Patient not taking: Reported on 01/20/2017) 100 each 12  . Blood Glucose Monitoring Suppl (ACCU-CHEK NANO SMARTVIEW) w/Device KIT 1 kit by Does not apply route once. (Patient not taking: Reported on 01/20/2017) 1 kit 0  . glucose blood test strip Use as instructed (Patient not taking: Reported on 01/20/2017) 100 each 12  . omeprazole (PRILOSEC) 20 MG capsule Take 1 capsule (20 mg total) by mouth daily. (Patient not taking: Reported on 04/13/2018) 30 capsule 3   No facility-administered medications prior to visit.     ROS Review of Systems  Constitutional: Negative for activity change, appetite change and fatigue.  HENT: Negative for congestion, sinus pressure and sore throat.   Eyes: Negative for visual disturbance.  Respiratory: Negative for cough, chest  tightness, shortness of breath and wheezing.   Cardiovascular: Negative for chest pain and palpitations.  Gastrointestinal: Positive for abdominal pain. Negative for abdominal distention and constipation.  Endocrine: Negative for polydipsia.  Genitourinary: Positive for flank pain. Negative for dysuria and frequency.  Musculoskeletal: Negative for arthralgias and back pain.  Skin: Negative for rash.  Neurological: Negative for tremors, light-headedness and numbness.  Hematological: Does not bruise/bleed easily.  Psychiatric/Behavioral: Negative for agitation and behavioral problems.    Objective:  BP 96/63   Pulse 78   Temp 97.8 F (36.6 C) (Oral)   Wt 137 lb 3.2 oz (62.2 kg)   SpO2 99%   BMI 25.92 kg/m   BP/Weight 04/13/2018 03/17/2018 7/49/4496  Systolic BP 96 99 759  Diastolic BP 63 62 68  Wt. (Lbs) 137.2 - -  BMI 25.92 - -      Physical Exam  Constitutional: She is oriented to person, place, and time. She appears well-developed and well-nourished.  Cardiovascular: Normal rate, normal heart sounds and intact distal pulses.  No murmur heard. Pulmonary/Chest: Effort normal and breath sounds normal. She has no wheezes. She has no rales. She exhibits no tenderness.  Abdominal: Soft. Bowel sounds are normal. She exhibits no distension and no mass. There is no tenderness.  Musculoskeletal: Normal range of motion.  No CVA tenderness  Neurological: She is alert and oriented to person, place, and time.  Skin: Skin is warm and dry.  Psychiatric: She has a normal mood and affect.    Lab  Results  Component Value Date   HGBA1C 5.3 04/13/2018    Assessment & Plan:   1. Flank pain - cyclobenzaprine (FLEXERIL) 10 MG tablet; Take 1 tablet (10 mg total) by mouth 2 (two) times daily as needed for muscle spasms.  Dispense: 30 tablet; Refill: 1  2. Abdominal bloating - H. pylori breath test - CMP14+EGFR  3. History of gestational diabetes Controlled with A1c of 5.6 - POCT  glycosylated hemoglobin (Hb A1C)   Meds ordered this encounter  Medications  . cyclobenzaprine (FLEXERIL) 10 MG tablet    Sig: Take 1 tablet (10 mg total) by mouth 2 (two) times daily as needed for muscle spasms.    Dispense:  30 tablet    Refill:  1    Follow-up: Return in about 3 months (around 07/14/2018) for follow up of chronic medical conditions.   Charlott Rakes MD

## 2018-04-14 ENCOUNTER — Encounter: Payer: Self-pay | Admitting: Family Medicine

## 2018-04-14 LAB — CMP14+EGFR
ALBUMIN: 4.6 g/dL (ref 3.5–5.5)
ALK PHOS: 71 IU/L (ref 39–117)
ALT: 15 IU/L (ref 0–32)
AST: 14 IU/L (ref 0–40)
Albumin/Globulin Ratio: 1.6 (ref 1.2–2.2)
BUN/Creatinine Ratio: 20 (ref 9–23)
BUN: 9 mg/dL (ref 6–20)
Bilirubin Total: 0.2 mg/dL (ref 0.0–1.2)
CALCIUM: 8.6 mg/dL — AB (ref 8.7–10.2)
CO2: 20 mmol/L (ref 20–29)
CREATININE: 0.44 mg/dL — AB (ref 0.57–1.00)
Chloride: 104 mmol/L (ref 96–106)
GFR calc Af Amer: 162 mL/min/{1.73_m2} (ref >59)
GFR, EST NON AFRICAN AMERICAN: 141 mL/min/{1.73_m2} (ref >59)
GLUCOSE: 80 mg/dL (ref 65–99)
Globulin, Total: 2.9 g/dL (ref 1.5–4.5)
Potassium: 3.8 mmol/L (ref 3.5–5.2)
Sodium: 138 mmol/L (ref 134–144)
Total Protein: 7.5 g/dL (ref 6.0–8.5)

## 2018-04-14 LAB — H. PYLORI BREATH TEST: H pylori Breath Test: POSITIVE — AB

## 2018-04-16 ENCOUNTER — Other Ambulatory Visit: Payer: Self-pay | Admitting: Family Medicine

## 2018-04-16 ENCOUNTER — Telehealth: Payer: Self-pay

## 2018-04-16 MED ORDER — CLARITHROMYCIN 500 MG PO TABS: 500.0000 mg | ORAL_TABLET | Freq: Two times a day (BID) | ORAL | 0 refills | Status: DC

## 2018-04-16 MED ORDER — AMOXICILLIN 500 MG PO CAPS: 1000.0000 mg | ORAL_CAPSULE | Freq: Two times a day (BID) | ORAL | 0 refills | Status: DC

## 2018-04-16 MED ORDER — OMEPRAZOLE 20 MG PO CPDR: 20.0000 mg | DELAYED_RELEASE_CAPSULE | Freq: Every day | ORAL | 0 refills | Status: DC

## 2018-04-16 MED FILL — OMEPRAZOLE 20 MG CAP: 20 | 28 days supply | Qty: 28 | Fill #0

## 2018-04-16 MED FILL — AMOXICILLIN 500 MG CAPSULE: 500 | 14 days supply | Qty: 56 | Fill #0

## 2018-04-16 MED FILL — CLARITHROMYCIN 500 MG TAB: 500 | 14 days supply | Qty: 28 | Fill #0

## 2018-04-16 NOTE — Telephone Encounter (Signed)
-----   Message from Hoy Register, MD sent at 04/14/2018 12:50 PM EDT ----- Please inform the patient that labs are normal. Thank you.

## 2018-04-16 NOTE — Telephone Encounter (Signed)
Patient was called and informed of lab results and medication being sent to the pharmacy.

## 2018-04-20 ENCOUNTER — Ambulatory Visit: Payer: Medicaid Other | Attending: Family Medicine

## 2018-05-19 NOTE — Congregational Nurse Program (Signed)
Congregational Nurse Program Note  Date of Encounter: 05/13/2018  Past Medical History: Past Medical History:  Diagnosis Date  . Gestational diabetes   . Hepatitis B infection    +test in first pregancy    Encounter Details: CNP Questionnaire - 05/19/18 1405      Questionnaire   Patient Status  Refugee    Race  African    Location Patient Served At  [facility]    Insurance  Medicaid    Uninsured  Not Applicable    Food  No food insecurities    Housing/Utilities  Yes, have permanent housing    Transportation  No transportation needs    Interpersonal Safety  Yes, feel physically and emotionally safe where you currently live    Medication  No medication insecurities    Medical Provider  No    Referrals  Other    ED Visit Averted  Not Applicable    Life-Saving Intervention Made  Not Applicable      Visit to nurse office at [facility] requesting assistance in [facility] device removed after four years. Appointment scheduled for clinic 05-13-18 at 2:30 pm. Interpreter requested for appointment.  Information confirmed with patient. Ferol LuzMarietta Lekita Kerekes, RN/CN

## 2018-11-09 ENCOUNTER — Encounter: Payer: Self-pay | Admitting: Family Medicine

## 2018-11-09 ENCOUNTER — Ambulatory Visit: Payer: Medicaid Other | Attending: Family Medicine | Admitting: Family Medicine

## 2018-11-09 VITALS — BP 118/76 | HR 81 | Temp 97.8°F | Wt 141.0 lb

## 2018-11-09 DIAGNOSIS — R109 Unspecified abdominal pain: Secondary | ICD-10-CM | POA: Diagnosis not present

## 2018-11-09 DIAGNOSIS — G8929 Other chronic pain: Secondary | ICD-10-CM | POA: Diagnosis not present

## 2018-11-09 DIAGNOSIS — B181 Chronic viral hepatitis B without delta-agent: Secondary | ICD-10-CM | POA: Diagnosis not present

## 2018-11-09 DIAGNOSIS — M549 Dorsalgia, unspecified: Secondary | ICD-10-CM | POA: Diagnosis not present

## 2018-11-09 DIAGNOSIS — R1013 Epigastric pain: Secondary | ICD-10-CM

## 2018-11-09 MED ORDER — CYCLOBENZAPRINE HCL 10 MG PO TABS: 10.0000 mg | ORAL_TABLET | Freq: Two times a day (BID) | ORAL | 1 refills | Status: DC | PRN

## 2018-11-09 NOTE — Progress Notes (Signed)
Subjective:  Patient ID: [name], female    DOB: [birth year]  Age: 27 y.o. MRN: [id number]  CC: Back Pain   HPI [name] is a 27 year old female with a history of Gestational Diabetes, Hepatitis B infection here tor a follow up visit. She complains of epigastric pain, abdominal bloating and intermittent pain for the last 4-5 months associated nausea but no vomiting, diarrhea, constipation.  6 months ago she was treated for Helicobacter pylori gastritis. She also has had left flank pain with prolonged sitting or performing house chores but denies urinary symptoms; prescription for Flexeril in the past which she has run out of.  With regards to her hepatitis B infection she was last seen by Dr., of infectious disease in 03/2017 and was supposed to follow-up in 6 months but never did. Seen with the aid of a video interpreter  Past Medical History:  Diagnosis Date  . Gestational diabetes   . Hepatitis B infection    +test in first pregancy    History reviewed. No pertinent surgical history.  No Known Allergies   Outpatient Medications Prior to Visit  Medication Sig Dispense Refill  . ACCU-CHEK FASTCLIX LANCETS MISC 1 Device by Percutaneous route 4 (four) times daily. (Patient not taking: Reported on 01/20/2017) 100 each 12  . amoxicillin (AMOXIL) 500 MG capsule Take 2 capsules (1,000 mg total) by mouth 2 (two) times daily. (Patient not taking: Reported on 11/09/2018) 56 capsule 0  . Blood Glucose Monitoring Suppl (ACCU-CHEK NANO SMARTVIEW) w/Device KIT 1 kit by Does not apply route once. (Patient not taking: Reported on 01/20/2017) 1 kit 0  . clarithromycin (BIAXIN) 500 MG tablet Take 1 tablet (500 mg total) by mouth 2 (two) times daily. (Patient not taking: Reported on 11/09/2018) 28 tablet 0  . glucose blood test strip Use as instructed (Patient not taking: Reported on 01/20/2017) 100 each 12  . omeprazole (PRILOSEC) 20 MG capsule Take 1 capsule (20 mg total) by mouth  daily. (Patient not taking: Reported on 11/09/2018) 28 capsule 0  . cyclobenzaprine (FLEXERIL) 10 MG tablet Take 1 tablet (10 mg total) by mouth 2 (two) times daily as needed for muscle spasms. (Patient not taking: Reported on 11/09/2018) 30 tablet 1   No facility-administered medications prior to visit.     ROS Review of Systems  Constitutional: Negative for activity change, appetite change and fatigue.  HENT: Negative for congestion, sinus pressure and sore throat.   Eyes: Negative for visual disturbance.  Respiratory: Negative for cough, chest tightness, shortness of breath and wheezing.   Cardiovascular: Negative for chest pain and palpitations.  Gastrointestinal: Positive for abdominal pain. Negative for abdominal distention and constipation.  Endocrine: Negative for polydipsia.  Genitourinary: Positive for flank pain. Negative for dysuria and frequency.  Musculoskeletal: Negative for arthralgias and back pain.  Skin: Negative for rash.  Neurological: Negative for tremors, light-headedness and numbness.  Hematological: Does not bruise/bleed easily.  Psychiatric/Behavioral: Negative for agitation and behavioral problems.    Objective:  BP 118/76   Pulse 81   Temp 97.8 F (36.6 C) (Oral)   Wt 141 lb (64 kg)   SpO2 100%   BMI 26.64 kg/m   BP/Weight 11/09/2018 01/12/5360 03/12/3153  Systolic BP 008 96 99  Diastolic BP 76 63 62  Wt. (Lbs) 141 137.2 -  BMI 26.64 25.92 -      Physical Exam  Constitutional: She is oriented to person, place, and time. She appears well-developed and well-nourished.  Cardiovascular: Normal  rate, normal heart sounds and intact distal pulses.  No murmur heard. Pulmonary/Chest: Effort normal and breath sounds normal. She has no wheezes. She has no rales. She exhibits no tenderness.  Abdominal: Soft. Bowel sounds are normal. She exhibits no distension and no mass. There is no tenderness.  Musculoskeletal: Normal range of motion.  Neurological: She is  alert and oriented to person, place, and time.  Skin: Skin is warm and dry.  Psychiatric: She has a normal mood and affect.     Assessment & Plan:   1. Flank pain Chronic intermittent Likely muscle spasm - cyclobenzaprine (FLEXERIL) 10 MG tablet; Take 1 tablet (10 mg total) by mouth 2 (two) times daily as needed for muscle spasms.  Dispense: 30 tablet; Refill: 1  2. Hepatitis B, chronic ([place]) Lost to follow-up - Ambulatory referral to Infectious Disease  3. Epigastric pain - H. pylori breath test - CMP14+EGFR - CBC with Differential/Platelet   Meds ordered this encounter  Medications  . cyclobenzaprine (FLEXERIL) 10 MG tablet    Sig: Take 1 tablet (10 mg total) by mouth 2 (two) times daily as needed for muscle spasms.    Dispense:  30 tablet    Refill:  1    Follow-up: Return in about 6 months (around 05/11/2019) for coordination of care.   Charlott Rakes MD

## 2018-11-10 LAB — CMP14+EGFR
ALBUMIN: 4.6 g/dL (ref 3.5–5.5)
ALT: 12 IU/L (ref 0–32)
AST: 14 IU/L (ref 0–40)
Albumin/Globulin Ratio: 1.5 (ref 1.2–2.2)
Alkaline Phosphatase: 57 IU/L (ref 39–117)
BUN / CREAT RATIO: 16 (ref 9–23)
BUN: 10 mg/dL (ref 6–20)
Bilirubin Total: <0.2 mg/dL (ref 0.0–1.2)
CALCIUM: 9.1 mg/dL (ref 8.7–10.2)
CO2: 21 mmol/L (ref 20–29)
CREATININE: 0.61 mg/dL (ref 0.57–1.00)
Chloride: 101 mmol/L (ref 96–106)
GFR calc non Af Amer: 126 mL/min/{1.73_m2} (ref >59)
GFR, EST AFRICAN AMERICAN: 146 mL/min/{1.73_m2} (ref >59)
GLUCOSE: 84 mg/dL (ref 65–99)
Globulin, Total: 3.1 g/dL (ref 1.5–4.5)
Potassium: 4.2 mmol/L (ref 3.5–5.2)
Sodium: 139 mmol/L (ref 134–144)
TOTAL PROTEIN: 7.7 g/dL (ref 6.0–8.5)

## 2018-11-10 LAB — CBC WITH DIFFERENTIAL/PLATELET
Basophils Absolute: 0.1 10*3/uL (ref 0.0–0.2)
Basos: 1 %
EOS (ABSOLUTE): 0.2 10*3/uL (ref 0.0–0.4)
EOS: 2 %
Hematocrit: 39 % (ref 34.0–46.6)
Hemoglobin: 12.9 g/dL (ref 11.1–15.9)
Immature Grans (Abs): 0 10*3/uL (ref 0.0–0.1)
Immature Granulocytes: 0 %
LYMPHS ABS: 1.7 10*3/uL (ref 0.7–3.1)
Lymphs: 17 %
MCH: 29.2 pg (ref 26.6–33.0)
MCHC: 33.1 g/dL (ref 31.5–35.7)
MCV: 88 fL (ref 79–97)
MONOCYTES: 6 %
Monocytes Absolute: 0.6 10*3/uL (ref 0.1–0.9)
NEUTROS ABS: 8 10*3/uL — AB (ref 1.4–7.0)
Neutrophils: 74 %
Platelets: 322 10*3/uL (ref 150–450)
RBC: 4.42 x10E6/uL (ref 3.77–5.28)
RDW: 12.8 % (ref 12.3–15.4)
WBC: 10.5 10*3/uL (ref 3.4–10.8)

## 2018-11-11 ENCOUNTER — Other Ambulatory Visit: Payer: Self-pay | Admitting: Family Medicine

## 2018-11-11 LAB — H. PYLORI BREATH TEST: H pylori Breath Test: POSITIVE — AB

## 2018-11-11 MED ORDER — OMEPRAZOLE 20 MG PO CPDR: 20.0000 mg | DELAYED_RELEASE_CAPSULE | Freq: Every day | ORAL | 0 refills | Status: DC

## 2018-11-11 MED ORDER — AMOXICILLIN 500 MG PO CAPS: 1000.0000 mg | ORAL_CAPSULE | Freq: Two times a day (BID) | ORAL | 0 refills | Status: DC

## 2018-11-11 MED ORDER — CLARITHROMYCIN 500 MG PO TABS: 500.0000 mg | ORAL_TABLET | Freq: Two times a day (BID) | ORAL | 0 refills | Status: DC

## 2018-11-16 ENCOUNTER — Ambulatory Visit (INDEPENDENT_AMBULATORY_CARE_PROVIDER_SITE_OTHER): Payer: Medicaid Other | Admitting: Internal Medicine

## 2018-11-16 ENCOUNTER — Encounter: Payer: Self-pay | Admitting: Internal Medicine

## 2018-11-16 VITALS — BP 114/79 | HR 80 | Temp 98.5°F | Wt 141.0 lb

## 2018-11-16 DIAGNOSIS — B181 Chronic viral hepatitis B without delta-agent: Secondary | ICD-10-CM

## 2018-11-16 DIAGNOSIS — Z603 Acculturation difficulty: Secondary | ICD-10-CM

## 2018-11-16 NOTE — Progress Notes (Signed)
   Subjective:    Patient ID: [name], female    DOB: 07/04/1991, 27 y.o.   MRN: [id number]  HPI Here for follow up of chronic hepatitis B. 20 minutes late to appt. I saw her as a new patient in April 2018 when I did labs and had her scheduled for an ultrasound with elastography.  She is now returning for follow-up after missing appointments previously.  Fortunately, her elastography shows really no fibrosis and her viral DNA is minimally elevated, last time at just 46 copies.  She has hepatitis a reactive.  She is hepatitis B E. Antibody Positive and E. Antigen Negative.  She is hepatitis C negative.  She reports no new issues.   Review of Systems  Constitutional: Negative for fatigue.  Gastrointestinal: Negative for diarrhea and nausea.  Skin: Negative for rash.       Objective:   Physical Exam  Constitutional: She appears well-developed and well-nourished. No distress.  Eyes: No scleral icterus.  Cardiovascular: Normal rate, regular rhythm and normal heart sounds.  Pulmonary/Chest: Effort normal and breath sounds normal. No respiratory distress.  Skin: No rash noted.   SH: no alcohol       Assessment & Plan:

## 2018-11-16 NOTE — Assessment & Plan Note (Signed)
Discussion via interpretor.

## 2018-11-16 NOTE — Assessment & Plan Note (Signed)
Has had minimal activity with the DNA.  Will recheck today and rtc 2 weeks to discuss results. With no fibrosis and LFTs with no significant inflammation, treatment not indicated.

## 2018-11-20 LAB — COMPLETE METABOLIC PANEL WITH GFR
AG RATIO: 1.3 (calc) (ref 1.0–2.5)
ALT: 10 U/L (ref 6–29)
AST: 12 U/L (ref 10–30)
Albumin: 4.4 g/dL (ref 3.6–5.1)
Alkaline phosphatase (APISO): 51 U/L (ref 33–115)
BUN: 8 mg/dL (ref 7–25)
CO2: 25 mmol/L (ref 20–32)
Calcium: 9.3 mg/dL (ref 8.6–10.2)
Chloride: 104 mmol/L (ref 98–110)
Creat: 0.66 mg/dL (ref 0.50–1.10)
GFR, Est African American: 142 mL/min/{1.73_m2} (ref >=60)
GFR, Est Non African American: 123 mL/min/{1.73_m2} (ref >=60)
Globulin: 3.5 g/dL (calc) (ref 1.9–3.7)
Glucose, Bld: 100 mg/dL — ABNORMAL HIGH (ref 65–99)
POTASSIUM: 4.3 mmol/L (ref 3.5–5.3)
Sodium: 138 mmol/L (ref 135–146)
Total Bilirubin: 0.4 mg/dL (ref 0.2–1.2)
Total Protein: 7.9 g/dL (ref 6.1–8.1)

## 2018-11-20 LAB — HEPATITIS B DNA, ULTRAQUANTITATIVE, PCR
Hepatitis B DNA (Calc): 1.95 Log IU/mL — ABNORMAL HIGH
Hepatitis B DNA: 90 IU/mL — ABNORMAL HIGH

## 2018-11-20 LAB — HEPATITIS DELTA VIRUS ANTIGEN: Hepatitis D Antigen: NOT DETECTED

## 2018-11-20 LAB — HEPATITIS B SURFACE ANTIGEN: Hepatitis B Surface Ag: REACTIVE — AB

## 2018-11-25 ENCOUNTER — Telehealth: Payer: Self-pay

## 2018-11-25 NOTE — Telephone Encounter (Signed)
-----   Message from Hoy RegisterEnobong Newlin, MD sent at 11/10/2018  5:02 PM EST ----- Please inform the patient that labs are normal. Thank you.

## 2018-11-25 NOTE — Telephone Encounter (Signed)
Patient was called and informed of lab results and medication being sent to pharmacy. 

## 2018-12-21 ENCOUNTER — Ambulatory Visit: Payer: Medicaid Other | Admitting: Internal Medicine

## 2018-12-22 ENCOUNTER — Ambulatory Visit (INDEPENDENT_AMBULATORY_CARE_PROVIDER_SITE_OTHER): Payer: Medicaid Other | Admitting: Internal Medicine

## 2018-12-22 ENCOUNTER — Encounter: Payer: Self-pay | Admitting: Internal Medicine

## 2018-12-22 DIAGNOSIS — B181 Chronic viral hepatitis B without delta-agent: Secondary | ICD-10-CM

## 2018-12-22 DIAGNOSIS — Z603 Acculturation difficulty: Secondary | ICD-10-CM

## 2018-12-22 NOTE — Assessment & Plan Note (Signed)
No active inflammation.  Will continue with monitoring.  rtc 1 year for repeat labs.

## 2018-12-22 NOTE — Progress Notes (Signed)
   Subjective:    Patient ID: [name], female    DOB: 1991/07/31, 28 y.o.   MRN: [id number]  HPI Here for follow up of chronic hepatitis B.  I saw her last month and I did labs and viral DNA has remained low, normal LFTs.  She is hepatitis B E. Antibody Positive and E. Antigen Negative.  She is hepatitis C negative.  She reports no new issues.   Review of Systems  Constitutional: Negative for fatigue.  Gastrointestinal: Negative for diarrhea and nausea.  Skin: Negative for rash.       Objective:   Physical Exam Constitutional:      General: She is not in acute distress.    Appearance: She is well-developed.  Eyes:     General: No scleral icterus. Cardiovascular:     Rate and Rhythm: Normal rate and regular rhythm.     Heart sounds: Normal heart sounds.  Pulmonary:     Effort: Pulmonary effort is normal. No respiratory distress.     Breath sounds: Normal breath sounds.  Skin:    Findings: No rash.          Assessment & Plan:

## 2018-12-22 NOTE — Assessment & Plan Note (Signed)
Interpreter used  

## 2019-11-17 ENCOUNTER — Ambulatory Visit: Payer: Medicaid Other | Admitting: Internal Medicine

## 2019-11-24 ENCOUNTER — Other Ambulatory Visit: Payer: Self-pay

## 2019-11-24 ENCOUNTER — Ambulatory Visit: Payer: Medicaid Other | Attending: Family Medicine | Admitting: Physician Assistant

## 2019-11-24 VITALS — BP 135/80 | HR 85 | Temp 98.4°F | Ht 61.0 in | Wt 137.0 lb

## 2019-11-24 DIAGNOSIS — Z79899 Other long term (current) drug therapy: Secondary | ICD-10-CM | POA: Diagnosis not present

## 2019-11-24 DIAGNOSIS — Z8632 Personal history of gestational diabetes: Secondary | ICD-10-CM | POA: Insufficient documentation

## 2019-11-24 DIAGNOSIS — R079 Chest pain, unspecified: Secondary | ICD-10-CM | POA: Insufficient documentation

## 2019-11-24 DIAGNOSIS — R109 Unspecified abdominal pain: Secondary | ICD-10-CM | POA: Insufficient documentation

## 2019-11-24 DIAGNOSIS — Z791 Long term (current) use of non-steroidal anti-inflammatories (NSAID): Secondary | ICD-10-CM | POA: Diagnosis not present

## 2019-11-24 DIAGNOSIS — Z131 Encounter for screening for diabetes mellitus: Secondary | ICD-10-CM

## 2019-11-24 DIAGNOSIS — Z603 Acculturation difficulty: Secondary | ICD-10-CM | POA: Diagnosis not present

## 2019-11-24 LAB — POCT URINALYSIS DIP (CLINITEK)
Bilirubin, UA: NEGATIVE
Glucose, UA: NEGATIVE mg/dL
Ketones, POC UA: NEGATIVE mg/dL
Leukocytes, UA: NEGATIVE
Nitrite, UA: NEGATIVE
POC PROTEIN,UA: NEGATIVE
Spec Grav, UA: 1.015 (ref 1.010–1.025)
Urobilinogen, UA: 0.2 E.U./dL
pH, UA: 5 (ref 5.0–8.0)

## 2019-11-24 LAB — POCT URINE PREGNANCY: Preg Test, Ur: NEGATIVE

## 2019-11-24 MED ORDER — MELOXICAM 15 MG PO TABS: 15.0000 mg | ORAL_TABLET | Freq: Every day | ORAL | 0 refills | Status: DC

## 2019-11-24 MED ORDER — METHOCARBAMOL 500 MG PO TABS: 500.0000 mg | ORAL_TABLET | Freq: Four times a day (QID) | ORAL | 0 refills | Status: DC | PRN

## 2019-11-24 MED FILL — METHOCARBAMOL 500 MG TABS: 500 | 22 days supply | Qty: 90 | Fill #0

## 2019-11-24 MED FILL — MELOXICAM 15 MG TABLET: 15 | 30 days supply | Qty: 30 | Fill #0

## 2019-11-24 NOTE — Patient Instructions (Signed)

## 2019-11-24 NOTE — Progress Notes (Signed)
Patient ID: [name], female   DOB: [birth year], 28 y.o.   MRN: [id number]   [name], is a 28 y.o. female  OVZ:[id number]  [id number]  DOB - 1991-01-01  Subjective:  Chief Complaint and HPI: [name] is a 28 y.o. female here today Pain in L flank and back area.  Describes pain in L flank as burning.  Pain comes and goes.  No h/o kidney stones.  Currently on her period.  No abdominal or pelvic pain.  No urinary s/sx.  This has been occurring more than 1 week.  She has not been taking anything for the discomfort.    And pain in chest area when leaning forward.  Pain for more than 1 week.  She had a fall earlier in the week w/o LOC but the pain started prior to this.  She c/o pain beneath both breasts when she bends over.  She denies fever, chills, cough.  No SOB.  No FH early cardiac issues.  Amharic interpreter being used.    Also-h/o gestational diabetes and she would like to be tested for diabetes.    ROS:   Constitutional:  No f/c, No night sweats, No unexplained weight loss. EENT:  No vision changes, No blurry vision, No hearing changes. No mouth, throat, or ear problems.  Respiratory: No cough, No SOB Cardiac: No CP, no palpitations GI:  No abd pain, No N/V/D. GU: No Urinary s/sx Musculoskeletal: see above Neuro: No headache, no dizziness, no motor weakness.  Skin: No rash Endocrine:  No polydipsia. No polyuria.  Psych: Denies SI/HI  No problems updated.  ALLERGIES: No Known Allergies  PAST MEDICAL HISTORY: Past Medical History:  Diagnosis Date  . Gestational diabetes   . Hepatitis B infection    +test in first pregancy    MEDICATIONS AT HOME: Prior to Admission medications   Medication Sig Start Date End Date Taking? Authorizing Provider  ACCU-CHEK FASTCLIX LANCETS MISC 1 Device by Percutaneous route 4 (four) times daily. Patient not taking: Reported on 11/16/2018 04/15/16   Woodroe Mode, MD  amoxicillin (AMOXIL) 500 MG capsule  Take 2 capsules (1,000 mg total) by mouth 2 (two) times daily. Patient not taking: Reported on 11/16/2018 11/11/18   Charlott Rakes, MD  Blood Glucose Monitoring Suppl (ACCU-CHEK NANO SMARTVIEW) w/Device KIT 1 kit by Does not apply route once. Patient not taking: Reported on 11/16/2018 04/22/16   Constant, Peggy, MD  clarithromycin (BIAXIN) 500 MG tablet Take 1 tablet (500 mg total) by mouth 2 (two) times daily. Patient not taking: Reported on 11/16/2018 11/11/18   Charlott Rakes, MD  cyclobenzaprine (FLEXERIL) 10 MG tablet Take 1 tablet (10 mg total) by mouth 2 (two) times daily as needed for muscle spasms. Patient not taking: Reported on 11/16/2018 11/09/18   Charlott Rakes, MD  glucose blood test strip Use as instructed Patient not taking: Reported on 11/16/2018 04/15/16   Woodroe Mode, MD  meloxicam (MOBIC) 15 MG tablet Take 1 tablet (15 mg total) by mouth daily. Prn pain 11/24/19   Argentina Donovan, PA-C  methocarbamol (ROBAXIN) 500 MG tablet Take 1 tablet (500 mg total) by mouth every 6 (six) hours as needed for muscle spasms. 11/24/19   Argentina Donovan, PA-C  omeprazole (PRILOSEC) 20 MG capsule Take 1 capsule (20 mg total) by mouth daily. Patient not taking: Reported on 11/16/2018 11/11/18   Charlott Rakes, MD     Objective:  EXAM:   Vitals:   11/24/19 1034  BP: 135/80  Pulse: 85  Temp: 98.4 F (36.9 C)  TempSrc: Oral  SpO2: 98%  Weight: 137 lb (62.1 kg)  Height: 5' 1"  (1.549 m)    General appearance : A&OX3. NAD. Non-toxic-appearing HEENT: Atraumatic and Normocephalic.  PERRLA. EOM intact.  Neck: supple, no JVD. No cervical lymphadenopathy. No thyromegaly Chest/Lungs:  Breathing-non-labored, Good air entry bilaterally, breath sounds normal without rales, rhonchi, or wheezing  CVS: S1 S2 regular, no murmurs, gallops, rubs  Mild TTP across upper abdomen just beneath B breasts/rib area Abdomen: Bowel sounds present, Non tender and not distended with no gaurding, rigidity or  rebound. Extremities: Bilateral Lower Ext shows no edema, both legs are warm to touch with = pulse throughout Neurology:  CN II-XII grossly intact, Non focal.   Psych:  TP linear. J/I WNL. Normal speech. Appropriate eye contact and affect.  Skin:  No Rash  Data Review Lab Results  Component Value Date   HGBA1C 5.3 04/13/2018   HGBA1C 5.4 01/20/2017   HGBA1C 5.6 10/08/2016     Assessment & Plan   1. Screening for diabetes mellitus with h/o gestational diabetes I have had a lengthy discussion and provided education about insulin resistance and the intake of too much sugar/refined carbohydrates.  I have advised the patient to work at a goal of eliminating sugary drinks, candy, desserts, sweets, refined sugars, processed foods, and white carbohydrates.  The patient expresses understanding.  - HgB A1c  2. Left flank pain ?shingles prodrome but no rash and wouldn't explain the pain in the B ribs area - POCT URINALYSIS DIP (CLINITEK) - POCT urine pregnancy - Comprehensive metabolic panel - CBC with Differential Rx mobic and methocarbamol  3. Language barrier, cultural differences stratus interpreters used and additional time performing visit was required.   4. Chest pain, unspecified type Likely musculoskeletal from lifting her young children. Rx mobic and methocarbamol    Patient have been counseled extensively about nutrition and exercise  Return in about 3 months (around 02/22/2020) for PCP-chronic conditions.  The patient was given clear instructions to go to ER or return to medical center if symptoms don't improve, worsen or new problems develop. The patient verbalized understanding. The patient was told to call to get lab results if they haven't heard anything in the next week.     Freeman Caldron, PA-C [facility] [place], [place]   11/24/2019, 11:23 AM

## 2019-11-24 NOTE — Progress Notes (Signed)
Pt. Stated she have left lower back pain with burning sensation.   Pt. Stated she had a fall last Sunday. Pt. Stated she fell forward while running with her kids.

## 2019-11-25 LAB — COMPREHENSIVE METABOLIC PANEL
ALT: 9 IU/L (ref 0–32)
AST: 15 IU/L (ref 0–40)
Albumin/Globulin Ratio: 1.5 (ref 1.2–2.2)
Albumin: 4.8 g/dL (ref 3.9–5.0)
Alkaline Phosphatase: 71 IU/L (ref 39–117)
BUN/Creatinine Ratio: 11 (ref 9–23)
BUN: 7 mg/dL (ref 6–20)
Bilirubin Total: 0.3 mg/dL (ref 0.0–1.2)
CO2: 19 mmol/L — ABNORMAL LOW (ref 20–29)
Calcium: 9.4 mg/dL (ref 8.7–10.2)
Chloride: 103 mmol/L (ref 96–106)
Creatinine, Ser: 0.61 mg/dL (ref 0.57–1.00)
GFR calc Af Amer: 145 mL/min/{1.73_m2} (ref >59)
GFR calc non Af Amer: 126 mL/min/{1.73_m2} (ref >59)
Globulin, Total: 3.2 g/dL (ref 1.5–4.5)
Glucose: 97 mg/dL (ref 65–99)
Potassium: 4.4 mmol/L (ref 3.5–5.2)
Sodium: 138 mmol/L (ref 134–144)
Total Protein: 8 g/dL (ref 6.0–8.5)

## 2019-11-25 LAB — CBC WITH DIFFERENTIAL/PLATELET
Basophils Absolute: 0 10*3/uL (ref 0.0–0.2)
Basos: 0 %
EOS (ABSOLUTE): 0.1 10*3/uL (ref 0.0–0.4)
Eos: 1 %
Hematocrit: 40 % (ref 34.0–46.6)
Hemoglobin: 13 g/dL (ref 11.1–15.9)
Immature Grans (Abs): 0 10*3/uL (ref 0.0–0.1)
Immature Granulocytes: 0 %
Lymphocytes Absolute: 1.6 10*3/uL (ref 0.7–3.1)
Lymphs: 17 %
MCH: 28.2 pg (ref 26.6–33.0)
MCHC: 32.5 g/dL (ref 31.5–35.7)
MCV: 87 fL (ref 79–97)
Monocytes Absolute: 0.6 10*3/uL (ref 0.1–0.9)
Monocytes: 7 %
Neutrophils Absolute: 6.9 10*3/uL (ref 1.4–7.0)
Neutrophils: 75 %
Platelets: 288 10*3/uL (ref 150–450)
RBC: 4.61 x10E6/uL (ref 3.77–5.28)
RDW: 13.1 % (ref 11.7–15.4)
WBC: 9.2 10*3/uL (ref 3.4–10.8)

## 2019-11-26 LAB — HEMOGLOBIN A1C
Est. average glucose Bld gHb Est-mCnc: 114 mg/dL
Hgb A1c MFr Bld: 5.6 % (ref 4.8–5.6)

## 2019-11-26 LAB — SPECIMEN STATUS REPORT

## 2019-12-23 ENCOUNTER — Ambulatory Visit: Payer: Medicaid Other | Admitting: Internal Medicine

## 2020-02-14 ENCOUNTER — Ambulatory Visit: Payer: Medicaid Other | Attending: Internal Medicine

## 2020-02-14 DIAGNOSIS — Z20822 Contact with and (suspected) exposure to covid-19: Secondary | ICD-10-CM | POA: Diagnosis not present

## 2020-02-15 LAB — NOVEL CORONAVIRUS, NAA: SARS-CoV-2, NAA: NOT DETECTED

## 2021-03-19 ENCOUNTER — Ambulatory Visit: Payer: Medicaid Other | Admitting: Family Medicine

## 2021-05-16 ENCOUNTER — Encounter: Payer: Self-pay | Admitting: Family Medicine

## 2021-05-16 ENCOUNTER — Other Ambulatory Visit: Payer: Self-pay

## 2021-05-16 ENCOUNTER — Ambulatory Visit: Payer: Medicaid Other | Attending: Family Medicine | Admitting: Family Medicine

## 2021-05-16 VITALS — BP 111/71 | HR 82 | Ht 61.0 in | Wt 136.0 lb

## 2021-05-16 DIAGNOSIS — R1013 Epigastric pain: Secondary | ICD-10-CM | POA: Diagnosis not present

## 2021-05-16 DIAGNOSIS — Z131 Encounter for screening for diabetes mellitus: Secondary | ICD-10-CM | POA: Diagnosis not present

## 2021-05-16 DIAGNOSIS — M549 Dorsalgia, unspecified: Secondary | ICD-10-CM | POA: Diagnosis not present

## 2021-05-16 DIAGNOSIS — K2900 Acute gastritis without bleeding: Secondary | ICD-10-CM

## 2021-05-16 MED ORDER — OMEPRAZOLE 40 MG PO CPDR: 40.0000 mg | DELAYED_RELEASE_CAPSULE | Freq: Every day | ORAL | 1 refills | Status: DC

## 2021-05-16 MED ORDER — METHOCARBAMOL 500 MG PO TABS: 500.0000 mg | ORAL_TABLET | Freq: Three times a day (TID) | ORAL | 1 refills | Status: DC | PRN

## 2021-05-16 NOTE — Progress Notes (Signed)
 Subjective:  Patient ID: [name], female    DOB: 08/15/1991  Age: 30 y.o. MRN: [id number]  CC: Back Pain   HPI [name] is a 30-year-old female with a history of gestational diabetes mellitus who presents today with the following concerns below.  Interval History: Complains of epigastric pain radiating to her chest sore when she touches it and this is unrelated to meals and she denies presence of reflux symptoms. It is associated with dyspnea. She tested positive for H. pylori in 04/2018 and again in 11/2018.  She was treated with triple antibiotic regimen. Also complains of lower back pain for 3 months present after prolonged standing and on bending. Currently not taking any medication for this. Past Medical History:  Diagnosis Date  . Gestational diabetes   . Hepatitis B infection    +test in first pregancy    History reviewed. No pertinent surgical history.  History reviewed. No pertinent family history.  No Known Allergies  Outpatient Medications Prior to Visit  Medication Sig Dispense Refill  . ACCU-CHEK FASTCLIX LANCETS MISC 1 Device by Percutaneous route 4 (four) times daily. (Patient not taking: No sig reported) 100 each 12  . Blood Glucose Monitoring Suppl (ACCU-CHEK NANO SMARTVIEW) w/Device KIT 1 kit by Does not apply route once. (Patient not taking: No sig reported) 1 kit 0  . glucose blood test strip Use as instructed (Patient not taking: No sig reported) 100 each 12  . meloxicam (MOBIC) 15 MG tablet Take 1 tablet (15 mg total) by mouth daily. Prn pain (Patient not taking: Reported on 05/16/2021) 30 tablet 0  . amoxicillin (AMOXIL) 500 MG capsule Take 2 capsules (1,000 mg total) by mouth 2 (two) times daily. (Patient not taking: No sig reported) 56 capsule 0  . clarithromycin (BIAXIN) 500 MG tablet Take 1 tablet (500 mg total) by mouth 2 (two) times daily. (Patient not taking: No sig reported) 28 tablet 0  . cyclobenzaprine (FLEXERIL) 10 MG  tablet Take 1 tablet (10 mg total) by mouth 2 (two) times daily as needed for muscle spasms. (Patient not taking: No sig reported) 30 tablet 1  . methocarbamol (ROBAXIN) 500 MG tablet Take 1 tablet (500 mg total) by mouth every 6 (six) hours as needed for muscle spasms. (Patient not taking: Reported on 05/16/2021) 90 tablet 0  . omeprazole (PRILOSEC) 20 MG capsule Take 1 capsule (20 mg total) by mouth daily. (Patient not taking: No sig reported) 28 capsule 0   No facility-administered medications prior to visit.     ROS Review of Systems  Constitutional: Negative for activity change, appetite change and fatigue.  HENT: Negative for congestion, sinus pressure and sore throat.   Eyes: Negative for visual disturbance.  Respiratory: Negative for cough, chest tightness, shortness of breath and wheezing.   Cardiovascular: Positive for chest pain. Negative for palpitations.  Gastrointestinal: Positive for abdominal pain. Negative for abdominal distention and constipation.  Endocrine: Negative for polydipsia.  Genitourinary: Negative for dysuria and frequency.  Musculoskeletal: Positive for back pain. Negative for arthralgias.  Skin: Negative for rash.  Neurological: Negative for tremors, light-headedness and numbness.  Hematological: Does not bruise/bleed easily.  Psychiatric/Behavioral: Negative for agitation and behavioral problems.    Objective:  BP 111/71   Pulse 82   Ht 5' 1" (1.549 m)   Wt 136 lb (61.7 kg)   SpO2 100%   BMI 25.70 kg/m   BP/Weight 05/16/2021 11/24/2019 12/22/2018  Systolic BP 111 135 104  Diastolic BP 71   80 71  Wt. (Lbs) 136 137 140  BMI 25.7 25.89 26.45      Physical Exam Constitutional:      Appearance: She is well-developed.  Neck:     Vascular: No JVD.  Cardiovascular:     Rate and Rhythm: Normal rate.     Heart sounds: Normal heart sounds. No murmur heard.   Pulmonary:     Effort: Pulmonary effort is normal.     Breath sounds: Normal breath  sounds. No wheezing or rales.  Chest:     Chest wall: No tenderness.  Abdominal:     General: Bowel sounds are normal. There is no distension.     Palpations: Abdomen is soft. There is no mass.     Tenderness: There is no abdominal tenderness.  Musculoskeletal:        General: Normal range of motion.     Right lower leg: No edema.     Left lower leg: No edema.  Neurological:     Mental Status: She is alert and oriented to person, place, and time.  Psychiatric:        Mood and Affect: Mood normal.     CMP Latest Ref Rng & Units 11/24/2019 11/16/2018 11/09/2018  Glucose 65 - 99 mg/dL 97 100(H) 84  BUN 6 - 20 mg/dL _0 Creatinine 0.57 - 1.00 mg/dL 0.61 0.66 0.61  Sodium 134 - 144 mmol/L 138 138 139  Potassium 3.5 - 5.2 mmol/L 4.4 4.3 4.2  Chloride 96 - 106 mmol/L 103 104 101  CO2 20 - 29 mmol/L 19(L) 25 21  Calcium 8.7 - 10.2 mg/dL 9.4 9.3 9.1  Total Protein 6.0 - 8.5 g/dL 8.0 7.9 7.7  Total Bilirubin 0.0 - 1.2 mg/dL 0.3 0.4 <0.2  Alkaline Phos 39 - 117 IU/L 71 - 57  AST 0 - 40 IU/L _1 ALT 0 - 32 IU/L _2 Lipid Panel     Component Value Date/Time   CHOL 150 10/14/2016 0912   TRIG 32 10/14/2016 0912   HDL 52 10/14/2016 0912   CHOLHDL 2.9 10/14/2016 0912   VLDL 6 10/14/2016 0912   LDLCALC 92 10/14/2016 0912    CBC    Component Value Date/Time   WBC 9.2 11/24/2019 1140   WBC 8.6 04/07/2017 0923   RBC 4.61 11/24/2019 1140   RBC 4.40 04/07/2017 0923   HGB 13.0 11/24/2019 1140   HGB 12.3 11/22/2015 0000   HCT 40.0 11/24/2019 1140   HCT 37 11/22/2015 0000   PLT 288 11/24/2019 1140   PLT 270 11/22/2015 0000   MCV 87 11/24/2019 1140   MCH 28.2 11/24/2019 1140   MCH 29.5 04/07/2017 0923   MCHC 32.5 11/24/2019 1140   MCHC 32.9 04/07/2017 0923   RDW 13.1 11/24/2019 1140   LYMPHSABS 1.6 11/24/2019 1140   MONOABS 728 01/20/2017 1414   EOSABS 0.1 11/24/2019 1140   BASOSABS 0.0 11/24/2019 1140    Lab Results  Component Value Date   HGBA1C 5.6  11/24/2019    Assessment & Plan:  1. Acute gastritis without hemorrhage, unspecified gastritis type Uncontrolled Will check for H. pylori and if positive will treat and refer to GI Prilosec initiated - H. pylori breath test - Basic Metabolic Panel - omeprazole (PRILOSEC) 40 MG capsule; Take 1 capsule (40 mg total) by mouth daily.  Dispense: 30 capsule; Refill: 1  2. Musculoskeletal back pain Advised to apply heat We will place on  Robaxin - methocarbamol (ROBAXIN) 500 MG tablet; Take 1 tablet (500 mg total) by mouth every 8 (eight) hours as needed for muscle spasms.  Dispense: 60 tablet; Refill: 1  3. Screening for diabetes mellitus History of gestational diabetes mellitus - Hemoglobin A1c    Meds ordered this encounter  Medications  . methocarbamol (ROBAXIN) 500 MG tablet    Sig: Take 1 tablet (500 mg total) by mouth every 8 (eight) hours as needed for muscle spasms.    Dispense:  60 tablet    Refill:  1  . omeprazole (PRILOSEC) 40 MG capsule    Sig: Take 1 capsule (40 mg total) by mouth daily.    Dispense:  30 capsule    Refill:  1    Follow-up: Return in about 1 month (around 06/15/2021), or if symptoms worsen or fail to improve, for Complete physical exam.       Enobong Newlin, MD, FAAFP. [facility] [place], [place] [phone]   05/16/2021, 5:18 PM 

## 2021-05-16 NOTE — Patient Instructions (Signed)
https://[email or web address]">  Chronic Back Pain When back pain lasts longer than 3 months, it is called chronic back pain. The cause of your back pain may not be known. Some common causes include:  Wear and tear (degenerative disease) of the bones, ligaments, or disks in your back.  Inflammation and stiffness in your back (arthritis). People who have chronic back pain often go through certain periods in which the pain is more intense (flare-ups). Many people can learn to manage the pain with home care. Follow these instructions at home: Pay attention to any changes in your symptoms. Take these actions to help with your pain: Managing pain and stiffness  If directed, apply ice to the painful area. Your health care provider may recommend applying ice during the first 24-48 hours after a flare-up begins. To do this: ? Put ice in a plastic bag. ? Place a towel between your skin and the bag. ? Leave the ice on for 20 minutes, 2-3 times per day.  If directed, apply heat to the affected area as often as told by your health care provider. Use the heat source that your health care provider recommends, such as a moist heat pack or a heating pad. ? Place a towel between your skin and the heat source. ? Leave the heat on for 20-30 minutes. ? Remove the heat if your skin turns bright red. This is especially important if you are unable to feel pain, heat, or cold. You may have a greater risk of getting burned.  Try soaking in a warm tub.      Activity  Avoid bending and other activities that make the problem worse.  Maintain a proper position when standing or sitting: ? When standing, keep your upper back and neck straight, with your shoulders pulled back. Avoid slouching. ? When sitting, keep your back straight and relax your shoulders. Do not round your shoulders or pull them backward.  Do not sit or stand in one place for long periods of time.  Take brief periods of rest  throughout the day. This will reduce your pain. Resting in a lying or standing position is usually better than sitting to rest.  When you are resting for longer periods, mix in some mild activity or stretching between periods of rest. This will help to prevent stiffness and pain.  Get regular exercise. Ask your health care provider what activities are safe for you.  Do not lift anything that is heavier than 10 lb (4.5 kg), or the limit that you are told, until your health care provider says that it is safe. Always use proper lifting technique, which includes: ? Bending your knees. ? Keeping the load close to your body. ? Avoiding twisting.  Sleep on a firm mattress in a comfortable position. Try lying on your side with your knees slightly bent. If you lie on your back, put a pillow under your knees.   Medicines  Treatment may include medicines for pain and inflammation taken by mouth or applied to the skin, prescription pain medicine, or muscle relaxants. Take over-the-counter and prescription medicines only as told by your health care provider.  Ask your health care provider if the medicine prescribed to you: ? Requires you to avoid driving or using machinery. ? Can cause constipation. You may need to take these actions to prevent or treat constipation:  Drink enough fluid to keep your urine pale yellow.  Take over-the-counter or prescription medicines.  Eat foods that are high in fiber, such as   beans, whole grains, and fresh fruits and vegetables.  Limit foods that are high in fat and processed sugars, such as fried or sweet foods. General instructions  Do not use any products that contain nicotine or tobacco, such as cigarettes, e-cigarettes, and chewing tobacco. If you need help quitting, ask your health care provider.  Keep all follow-up visits as told by your health care provider. This is important. Contact a health care provider if:  You have pain that is not relieved with  rest or medicine.  Your pain gets worse, or you have new pain.  You have a high fever.  You have rapid weight loss.  You have trouble doing your normal activities. Get help right away if:  You have weakness or numbness in one or both of your legs or feet.  You have trouble controlling your bladder or your bowels.  You have severe back pain and have any of the following: ? Nausea or vomiting. ? Pain in your abdomen. ? Shortness of breath or you faint. Summary  Chronic back pain is back pain that lasts longer than 3 months.  When a flare-up begins, apply ice to the painful area for the first 24-48 hours.  Apply a moist heat pad or use a heating pad on the painful area as directed by your health care provider.  When you are resting for longer periods, mix in some mild activity or stretching between periods of rest. This will help to prevent stiffness and pain. This information is not intended to replace advice given to you by your health care provider. Make sure you discuss any questions you have with your health care provider. Document Revised: 01/05/2020 Document Reviewed: 01/05/2020 Elsevier Patient Education  2021 [employer]vier Inc.  

## 2021-05-17 LAB — BASIC METABOLIC PANEL
BUN/Creatinine Ratio: 31 — ABNORMAL HIGH (ref 9–23)
BUN: 17 mg/dL (ref 6–20)
CO2: 22 mmol/L (ref 20–29)
Calcium: 8.8 mg/dL (ref 8.7–10.2)
Chloride: 102 mmol/L (ref 96–106)
Creatinine, Ser: 0.54 mg/dL — ABNORMAL LOW (ref 0.57–1.00)
Glucose: 90 mg/dL (ref 65–99)
Potassium: 4 mmol/L (ref 3.5–5.2)
Sodium: 136 mmol/L (ref 134–144)
eGFR: 129 mL/min/{1.73_m2} (ref >59)

## 2021-05-17 LAB — HEMOGLOBIN A1C
Est. average glucose Bld gHb Est-mCnc: 111 mg/dL
Hgb A1c MFr Bld: 5.5 % (ref 4.8–5.6)

## 2021-05-18 ENCOUNTER — Other Ambulatory Visit: Payer: Self-pay | Admitting: Family Medicine

## 2021-05-18 DIAGNOSIS — K2900 Acute gastritis without bleeding: Secondary | ICD-10-CM

## 2021-05-18 LAB — H. PYLORI BREATH TEST: H pylori Breath Test: POSITIVE — AB

## 2021-05-18 MED ORDER — CLARITHROMYCIN 500 MG PO TABS: 500.0000 mg | ORAL_TABLET | Freq: Two times a day (BID) | ORAL | 0 refills | Status: DC

## 2021-05-18 MED ORDER — AMOXICILLIN 500 MG PO CAPS: 1000.0000 mg | ORAL_CAPSULE | Freq: Two times a day (BID) | ORAL | 0 refills | Status: DC

## 2021-05-25 ENCOUNTER — Telehealth: Payer: Self-pay

## 2021-05-25 NOTE — Telephone Encounter (Signed)
-----   Message from Hoy Register, MD sent at 05/18/2021  9:13 AM EDT ----- Please inform her that she tested positive for H. pylori.  I have sent a prescription for 2 antibiotics to her pharmacy to be taken for 2 weeks and would like her to take the omeprazole I had prescribed at her last visit for only 2 weeks then discontinue.  She will need to repeat H. pylori testing in 1 month.

## 2021-05-25 NOTE — Telephone Encounter (Signed)
Patient name and DOB has been verified Patient was informed of lab results. Patient had no questions.  

## 2021-07-09 ENCOUNTER — Other Ambulatory Visit (HOSPITAL_COMMUNITY)
Admission: RE | Admit: 2021-07-09 | Discharge: 2021-07-09 | Disposition: A | Discharge status: Home / Self Care | Payer: Medicaid Other | Source: Ambulatory Visit | Attending: Family Medicine | Admitting: Family Medicine

## 2021-07-09 ENCOUNTER — Other Ambulatory Visit: Payer: Self-pay

## 2021-07-09 ENCOUNTER — Ambulatory Visit: Payer: Medicaid Other | Attending: Family Medicine | Admitting: Family Medicine

## 2021-07-09 ENCOUNTER — Encounter: Payer: Self-pay | Admitting: Family Medicine

## 2021-07-09 VITALS — BP 112/74 | HR 69 | Ht 61.0 in | Wt 138.8 lb

## 2021-07-09 DIAGNOSIS — H9202 Otalgia, left ear: Secondary | ICD-10-CM | POA: Insufficient documentation

## 2021-07-09 DIAGNOSIS — Z113 Encounter for screening for infections with a predominantly sexual mode of transmission: Secondary | ICD-10-CM | POA: Diagnosis not present

## 2021-07-09 DIAGNOSIS — B9681 Helicobacter pylori [H. pylori] as the cause of diseases classified elsewhere: Secondary | ICD-10-CM | POA: Diagnosis not present

## 2021-07-09 DIAGNOSIS — K2970 Gastritis, unspecified, without bleeding: Secondary | ICD-10-CM | POA: Diagnosis not present

## 2021-07-09 DIAGNOSIS — Z0001 Encounter for general adult medical examination with abnormal findings: Secondary | ICD-10-CM | POA: Diagnosis not present

## 2021-07-09 DIAGNOSIS — Z124 Encounter for screening for malignant neoplasm of cervix: Secondary | ICD-10-CM | POA: Insufficient documentation

## 2021-07-09 DIAGNOSIS — Z0000 Encounter for general adult medical examination without abnormal findings: Secondary | ICD-10-CM

## 2021-07-09 MED ORDER — CIPROFLOXACIN-DEXAMETHASONE 0.3-0.1 % OT SUSP: 4.0000 [drp] | Freq: Two times a day (BID) | OTIC | 0 refills | Status: DC

## 2021-07-09 NOTE — Patient Instructions (Signed)
Health Maintenance, Female Adopting a healthy lifestyle and getting preventive care are important in promoting health and wellness. Ask your health care provider about: The right schedule for you to have regular tests and exams. Things you can do on your own to prevent diseases and keep yourself healthy. What should I know about diet, weight, and exercise? Eat a healthy diet  Eat a diet that includes plenty of vegetables, fruits, low-fat dairy products, and lean protein. Do not eat a lot of foods that are high in solid fats, added sugars, or sodium.  Maintain a healthy weight Body mass index (BMI) is used to identify weight problems. It estimates body fat based on height and weight. Your health care provider can help determineyour BMI and help you achieve or maintain a healthy weight. Get regular exercise Get regular exercise. This is one of the most important things you can do for your health. Most adults should: Exercise for at least 150 minutes each week. The exercise should increase your heart rate and make you sweat (moderate-intensity exercise). Do strengthening exercises at least twice a week. This is in addition to the moderate-intensity exercise. Spend less time sitting. Even light physical activity can be beneficial. Watch cholesterol and blood lipids Have your blood tested for lipids and cholesterol at 28 years of age, then havethis test every 5 years. Have your cholesterol levels checked more often if: Your lipid or cholesterol levels are high. You are older than 28 years of age. You are at high risk for heart disease. What should I know about cancer screening? Depending on your health history and family history, you may need to have cancer screening at various ages. This may include screening for: Breast cancer. Cervical cancer. Colorectal cancer. Skin cancer. Lung cancer. What should I know about heart disease, diabetes, and high blood pressure? Blood pressure and heart  disease High blood pressure causes heart disease and increases the risk of stroke. This is more likely to develop in people who have high blood pressure readings, are of African descent, or are overweight. Have your blood pressure checked: Every 3-5 years if you are 18-39 years of age. Every year if you are 40 years old or older. Diabetes Have regular diabetes screenings. This checks your fasting blood sugar level. Have the screening done: Once every three years after age 40 if you are at a normal weight and have a low risk for diabetes. More often and at a younger age if you are overweight or have a high risk for diabetes. What should I know about preventing infection? Hepatitis B If you have a higher risk for hepatitis B, you should be screened for this virus. Talk with your health care provider to find out if you are at risk forhepatitis B infection. Hepatitis C Testing is recommended for: Everyone born from 1945 through 1965. Anyone with known risk factors for hepatitis C. Sexually transmitted infections (STIs) Get screened for STIs, including gonorrhea and chlamydia, if: You are sexually active and are younger than 28 years of age. You are older than 28 years of age and your health care provider tells you that you are at risk for this type of infection. Your sexual activity has changed since you were last screened, and you are at increased risk for chlamydia or gonorrhea. Ask your health care provider if you are at risk. Ask your health care provider about whether you are at high risk for HIV. Your health care provider may recommend a prescription medicine to help   prevent HIV infection. If you choose to take medicine to prevent HIV, you should first get tested for HIV. You should then be tested every 3 months for as long as you are taking the medicine. Pregnancy If you are about to stop having your period (premenopausal) and you may become pregnant, seek counseling before you get  pregnant. Take 400 to 800 micrograms (mcg) of folic acid every day if you become pregnant. Ask for birth control (contraception) if you want to prevent pregnancy. Osteoporosis and menopause Osteoporosis is a disease in which the bones lose minerals and strength with aging. This can result in bone fractures. If you are 65 years old or older, or if you are at risk for osteoporosis and fractures, ask your health care provider if you should: Be screened for bone loss. Take a calcium or vitamin D supplement to lower your risk of fractures. Be given hormone replacement therapy (HRT) to treat symptoms of menopause. Follow these instructions at home: Lifestyle Do not use any products that contain nicotine or tobacco, such as cigarettes, e-cigarettes, and chewing tobacco. If you need help quitting, ask your health care provider. Do not use street drugs. Do not share needles. Ask your health care provider for help if you need support or information about quitting drugs. Alcohol use Do not drink alcohol if: Your health care provider tells you not to drink. You are pregnant, may be pregnant, or are planning to become pregnant. If you drink alcohol: Limit how much you use to 0-1 drink a day. Limit intake if you are breastfeeding. Be aware of how much alcohol is in your drink. In the [place] one drink equals one 12 oz bottle of beer (355 mL), one 5 oz glass of wine (148 mL), or one 1 oz glass of hard liquor (44 mL). General instructions Schedule regular health, dental, and eye exams. Stay current with your vaccines. Tell your health care provider if: You often feel depressed. You have ever been abused or do not feel safe at home. Summary Adopting a healthy lifestyle and getting preventive care are important in promoting health and wellness. Follow your health care provider's instructions about healthy diet, exercising, and getting tested or screened for diseases. Follow your health care provider's  instructions on monitoring your cholesterol and blood pressure. This information is not intended to replace advice given to you by your health care provider. Make sure you discuss any questions you have with your healthcare provider. Document Revised: 11/18/2018 Document Reviewed: 11/18/2018 Elsevier Patient Education  2022 [employer] Inc.  

## 2021-07-09 NOTE — Progress Notes (Signed)
Subjective:  Patient ID: [name], female    DOB: 1991-03-17  Age: 30 y.o. MRN: [id number]  CC: Annual Exam and Gynecologic Exam   HPI [name]  is a 30 year old female with a history of gestational diabetes mellitus. She presents for a complete physical exam.  Interval History: She sometimes has sharp L otalgia but has no hearing loss, fever, sinus symptoms. She was treated for Helicobacter pylori gastritis 6 weeks ago and was supposed to come back for a repeat test but never did.  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  No family history on file.  No Known Allergies  Outpatient Medications Prior to Visit  Medication Sig Dispense Refill   ACCU-CHEK FASTCLIX LANCETS MISC 1 Device by Percutaneous route 4 (four) times daily. (Patient not taking: No sig reported) 100 each 12   amoxicillin (AMOXIL) 500 MG capsule Take 2 capsules (1,000 mg total) by mouth 2 (two) times daily. (Patient not taking: Reported on 07/09/2021) 56 capsule 0   Blood Glucose Monitoring Suppl (ACCU-CHEK NANO SMARTVIEW) w/Device KIT 1 kit by Does not apply route once. (Patient not taking: No sig reported) 1 kit 0   clarithromycin (BIAXIN) 500 MG tablet Take 1 tablet (500 mg total) by mouth 2 (two) times daily. (Patient not taking: Reported on 07/09/2021) 28 tablet 0   glucose blood test strip Use as instructed (Patient not taking: No sig reported) 100 each 12   meloxicam (MOBIC) 15 MG tablet Take 1 tablet (15 mg total) by mouth daily. Prn pain (Patient not taking: No sig reported) 30 tablet 0   methocarbamol (ROBAXIN) 500 MG tablet Take 1 tablet (500 mg total) by mouth every 8 (eight) hours as needed for muscle spasms. (Patient not taking: Reported on 07/09/2021) 60 tablet 1   No facility-administered medications prior to visit.     ROS Review of Systems  Constitutional:   Negative for activity change, appetite change and fatigue.  HENT:  Positive for ear pain. Negative for congestion, sinus pressure and sore throat.   Eyes:  Negative for visual disturbance.  Respiratory:  Negative for cough, chest tightness, shortness of breath and wheezing.   Cardiovascular:  Negative for chest pain and palpitations.  Gastrointestinal:  Negative for abdominal distention, abdominal pain and constipation.  Endocrine: Negative for polydipsia.  Genitourinary:  Negative for dysuria and frequency.  Musculoskeletal:  Negative for arthralgias and back pain.  Skin:  Negative for rash.  Neurological:  Negative for tremors, light-headedness and numbness.  Hematological:  Does not bruise/bleed easily.  Psychiatric/Behavioral:  Negative for agitation and behavioral problems.    Objective:  BP 112/74   Pulse 69   Ht _0  (1.549 m)   Wt 138 lb 12.8 oz (63 kg)   SpO2 100%   BMI 26.23 kg/m   BP/Weight 07/09/2021 05/16/2021 48/18/5631  Systolic BP 497 026 378  Diastolic BP 74 71 80  Wt. (Lbs) 138.8 136 137  BMI 26.23 25.7 25.89      Physical Exam Exam conducted with a chaperone present.  Constitutional:      General: She is not in acute distress.    Appearance: She is well-developed. She is not diaphoretic.  HENT:     Head: Normocephalic.     Right Ear: External ear normal.     Left Ear: External ear normal.  Ears:     Comments: Tenderness in left eustachian tube during otoscopy    Nose: Nose normal.  Eyes:     Conjunctiva/sclera: Conjunctivae normal.     Pupils: Pupils are equal, round, and reactive to light.  Neck:     Vascular: No JVD.  Cardiovascular:     Rate and Rhythm: Normal rate and regular rhythm.     Heart sounds: Normal heart sounds. No murmur heard.   No gallop.  Pulmonary:     Effort: Pulmonary effort is normal. No respiratory distress.     Breath sounds: Normal breath sounds. No wheezing or rales.  Chest:     Chest wall: No tenderness.   Breasts:    Right: Normal. No mass, nipple discharge, tenderness, axillary adenopathy or supraclavicular adenopathy.     Left: Normal. No mass, nipple discharge, tenderness, axillary adenopathy or supraclavicular adenopathy.  Abdominal:     General: Bowel sounds are normal. There is no distension.     Palpations: Abdomen is soft. There is no mass.     Tenderness: There is no abdominal tenderness.     Hernia: There is no hernia in the left inguinal area or right inguinal area.  Genitourinary:    General: Normal vulva.     Pubic Area: No rash.      Labia:        Right: No rash.        Left: No rash.      Vagina: Normal.     Cervix: Normal.     Uterus: Normal.      Adnexa: Right adnexa normal and left adnexa normal.       Right: No tenderness.         Left: No tenderness.    Musculoskeletal:        General: No tenderness. Normal range of motion.     Cervical back: Normal range of motion. No tenderness.  Lymphadenopathy:     Upper Body:     Right upper body: No supraclavicular or axillary adenopathy.     Left upper body: No supraclavicular or axillary adenopathy.  Skin:    General: Skin is warm and dry.  Neurological:     Mental Status: She is alert and oriented to person, place, and time.     Deep Tendon Reflexes: Reflexes are normal and symmetric.    CMP Latest Ref Rng & Units 05/16/2021 11/24/2019 11/16/2018  Glucose 65 - 99 mg/dL 90 97 100(H)  BUN 6 - 20 mg/dL _0 Creatinine 0.57 - 1.00 mg/dL 0.54(L) 0.61 0.66  Sodium 134 - 144 mmol/L 136 138 138  Potassium 3.5 - 5.2 mmol/L 4.0 4.4 4.3  Chloride 96 - 106 mmol/L 102 103 104  CO2 20 - 29 mmol/L 22 19(L) 25  Calcium 8.7 - 10.2 mg/dL 8.8 9.4 9.3  Total Protein 6.0 - 8.5 g/dL - 8.0 7.9  Total Bilirubin 0.0 - 1.2 mg/dL - 0.3 0.4  Alkaline Phos 39 - 117 IU/L - 71 -  AST 0 - 40 IU/L - 15 12  ALT 0 - 32 IU/L - 9 10    Lipid Panel     Component Value Date/Time   CHOL 150 10/14/2016 0912   TRIG 32 10/14/2016 0912   HDL  52 10/14/2016 0912   CHOLHDL 2.9 10/14/2016 0912   VLDL 6 10/14/2016 0912   LDLCALC 92 10/14/2016 0912    CBC    Component Value Date/Time   WBC 9.2 11/24/2019 1140  WBC 8.6 04/07/2017 0923   RBC 4.61 11/24/2019 1140   RBC 4.40 04/07/2017 0923   HGB 13.0 11/24/2019 1140   HGB 12.3 11/22/2015 0000   HCT 40.0 11/24/2019 1140   HCT 37 11/22/2015 0000   PLT 288 11/24/2019 1140   PLT 270 11/22/2015 0000   MCV 87 11/24/2019 1140   MCH 28.2 11/24/2019 1140   MCH 29.5 04/07/2017 0923   MCHC 32.5 11/24/2019 1140   MCHC 32.9 04/07/2017 0923   RDW 13.1 11/24/2019 1140   LYMPHSABS 1.6 11/24/2019 1140   MONOABS 728 01/20/2017 1414   EOSABS 0.1 11/24/2019 1140   BASOSABS 0.0 11/24/2019 1140    Lab Results  Component Value Date   HGBA1C 5.5 05/16/2021    Assessment & Plan:  1. Annual physical exam Counseled on 150 minutes of exercise per week, healthy eating (including decreased daily intake of saturated fats, cholesterol, added sugars, sodium), STI prevention, routine healthcare maintenance.   2. Screening for cervical cancer - Cytology - PAP([place])  3. Screening for STD (sexually transmitted disease) - Cervicovaginal ancillary only  4. Helicobacter pylori gastritis Completed course of antibiotic treatment Will retest - H. pylori breath test  5. Left ear pain No evidence of otitis - ciprofloxacin-dexamethasone (CIPRODEX) OTIC suspension; Place 4 drops into the left ear 2 (two) times daily.  Dispense: 7.5 mL; Refill: 0   Meds ordered this encounter  Medications   ciprofloxacin-dexamethasone (CIPRODEX) OTIC suspension    Sig: Place 4 drops into the left ear 2 (two) times daily.    Dispense:  7.5 mL    Refill:  0    Follow-up: No follow-ups on file.       Charlott Rakes, MD, FAAFP. [facility] [place], [place]   07/09/2021, 5:29 PM

## 2021-07-09 NOTE — Progress Notes (Signed)
Physical/Pap 

## 2021-07-10 LAB — CERVICOVAGINAL ANCILLARY ONLY
Bacterial Vaginitis (gardnerella): NEGATIVE
Candida Glabrata: NEGATIVE
Candida Vaginitis: NEGATIVE
Chlamydia: NEGATIVE
Comment: NEGATIVE
Comment: NEGATIVE
Comment: NEGATIVE
Comment: NEGATIVE
Comment: NEGATIVE
Comment: NORMAL
Neisseria Gonorrhea: NEGATIVE
Trichomonas: NEGATIVE

## 2021-07-10 LAB — CYTOLOGY - PAP: Diagnosis: NEGATIVE

## 2021-07-11 ENCOUNTER — Other Ambulatory Visit: Payer: Self-pay | Admitting: Family Medicine

## 2021-07-11 DIAGNOSIS — B9681 Helicobacter pylori [H. pylori] as the cause of diseases classified elsewhere: Secondary | ICD-10-CM

## 2021-07-11 DIAGNOSIS — K2970 Gastritis, unspecified, without bleeding: Secondary | ICD-10-CM

## 2021-07-11 LAB — H. PYLORI BREATH TEST: H pylori Breath Test: POSITIVE — AB

## 2021-07-23 ENCOUNTER — Telehealth: Payer: Self-pay

## 2021-07-23 NOTE — Telephone Encounter (Signed)
Patient was called and a voicemail was left informing patient to return phone call for lab results.  ° °Crm created. °

## 2021-07-23 NOTE — Telephone Encounter (Signed)
-----   Message from Hoy Register, MD sent at 07/11/2021  1:22 PM EDT ----- Please inform her that her PAP smear is normal

## 2021-07-23 NOTE — Telephone Encounter (Signed)
-----   Message from Hoy Register, MD sent at 07/11/2021  1:21 PM EDT ----- Please inform her that her H. pylori remains positive after treatment.  I have referred her to GI.

## 2021-09-25 ENCOUNTER — Other Ambulatory Visit (INDEPENDENT_AMBULATORY_CARE_PROVIDER_SITE_OTHER): Payer: Medicaid Other

## 2021-09-25 ENCOUNTER — Encounter: Payer: Self-pay | Admitting: Gastroenterology

## 2021-09-25 ENCOUNTER — Ambulatory Visit (INDEPENDENT_AMBULATORY_CARE_PROVIDER_SITE_OTHER): Payer: Medicaid Other | Admitting: Gastroenterology

## 2021-09-25 VITALS — BP 100/68 | HR 76 | Ht 61.0 in | Wt 140.0 lb

## 2021-09-25 DIAGNOSIS — A048 Other specified bacterial intestinal infections: Secondary | ICD-10-CM

## 2021-09-25 DIAGNOSIS — B181 Chronic viral hepatitis B without delta-agent: Secondary | ICD-10-CM

## 2021-09-25 LAB — HEPATIC FUNCTION PANEL
ALT: 13 U/L (ref 0–35)
AST: 14 U/L (ref 0–37)
Albumin: 4.4 g/dL (ref 3.5–5.2)
Alkaline Phosphatase: 47 U/L (ref 39–117)
Bilirubin, Direct: 0 mg/dL (ref 0.0–0.3)
Total Bilirubin: 0.3 mg/dL (ref 0.2–1.2)
Total Protein: 8.2 g/dL (ref 6.0–8.3)

## 2021-09-25 MED ORDER — OMEPRAZOLE 20 MG PO CPDR: 20.0000 mg | DELAYED_RELEASE_CAPSULE | Freq: Two times a day (BID) | ORAL | 0 refills | Status: DC

## 2021-09-25 MED ORDER — METRONIDAZOLE 250 MG PO TABS
250.0000 mg | ORAL_TABLET | Freq: Four times a day (QID) | ORAL | 0 refills | Status: DC
Start: 2021-09-25 — End: 2021-11-13

## 2021-09-25 MED ORDER — BISMUTH 262 MG PO CHEW: 262.0000 mg | CHEWABLE_TABLET | Freq: Four times a day (QID) | ORAL | 0 refills | Status: DC

## 2021-09-25 MED ORDER — DOXYCYCLINE HYCLATE 100 MG PO CAPS: 100.0000 mg | ORAL_CAPSULE | Freq: Two times a day (BID) | ORAL | 0 refills | Status: DC

## 2021-09-25 NOTE — Patient Instructions (Addendum)
If you are age 28 or older, your body mass index should be between 23-30. Your Body mass index is 26.45 kg/m. If this is out of the aforementioned range listed, please consider follow up with your Primary Care Provider.  If you are age 79 or younger, your body mass index should be between 19-25. Your Body mass index is 26.45 kg/m. If this is out of the aformentioned range listed, please consider follow up with your Primary Care Provider.   We have sent the following medications to your pharmacy for you to pick up at your convenience:   Omeprazole 20 mg Take one tablet twice daily for 14 days  Flagyl 250 mg  Take one tablet four times daily for 14 days   Doxycycline 100 mg take twice daily for 14 days   [place] 262 mg  take two tablets four times daily for 14 days   Come back for H pylori test 4 weeks after completing medication.  Your provider has requested that you go to the basement level for lab work before leaving today. Press "B" on the elevator. The lab is located at the first door on the left as you exit the elevator.   The [facility] providers would like to encourage you to use [facility] to communicate with providers for non-urgent requests or questions.  Due to long hold times on the telephone, sending your provider a message by [facility] may be a faster and more efficient way to get a response.  Please allow 48 business hours for a response.  Please remember that this is for non-urgent requests.   It was a pleasure to see you today!  Thank you for trusting me with your gastrointestinal care!    Scott E. Tomasa Rand, MD

## 2021-09-25 NOTE — Progress Notes (Signed)
HPI : [name] is a pleasant 30 year old female with a history of chronic hepatitis B and gestational diabetes who was referred to [place] by Dr. Hoy Register for further treatment of H. pylori.  The patient was found to have a positive H. pylori breath test in May 2019 and was prescribed triple therapy.  She was prescribed it again later that year.  She was retested again in this year and again prescribed triple therapy.  Although the patient has been prescribed triple therapy 3 times, it is unclear if she is taking it each time.  When asked if the patient took the antibiotics each time, she could not recall but does recall taking antibiotics recently.  She denies any major chronic GI symptoms.  Sometimes she has some problems with excessive gas and bloating.  She also does mention that when she bends over sometimes she feels like something "moves inside her chest".  This is been happening intermittently over the past 2 years or so.  She denies heartburn or acid regurgitation.  No nausea or vomiting.  She has regular bowel movements.  No family history of GI malignancy.  No known family history of liver disease. With regards to her hepatitis B, it appears this was diagnosed during her most recent pregnancy.  She has not ever been treated for hepatitis B.  She is currently not followed by a specialist.   Past Medical History:  Diagnosis Date   Gestational diabetes    Hepatitis B infection    +test in first pregancy     History reviewed. No pertinent surgical history. History reviewed. No pertinent family history. Social History   Tobacco Use   Smoking status: Never   Smokeless tobacco: Never  Substance Use Topics   Alcohol use: No   Drug use: No   No current outpatient medications on file.   No current facility-administered medications for this visit.   No Known Allergies   Review of Systems: All systems reviewed and negative except where noted in HPI.    No results  found.  Physical Exam: BP 100/68   Pulse 76   Ht 5\' 1"  (1.549 m)   Wt 140 lb (63.5 kg)   BMI 26.45 kg/m  Constitutional: Pleasant,well-developed, African female in no acute distress.  Accompanied by medical translator. HEENT: Normocephalic and atraumatic. Conjunctivae are normal. No scleral icterus. Cardiovascular: Normal rate, regular rhythm.  Pulmonary/chest: Effort normal and breath sounds normal. No wheezing, rales or rhonchi. Abdominal: Soft, nondistended, nontender. Bowel sounds active throughout. There are no masses palpable. No hepatomegaly. Extremities: no edema Neurological: Alert and oriented to person place and time. Skin: Skin is warm and dry. No rashes noted. Psychiatric: Normal mood and affect. Behavior is normal.  CBC    Component Value Date/Time   WBC 9.2 11/24/2019 1140   WBC 8.6 04/07/2017 0923   RBC 4.61 11/24/2019 1140   RBC 4.40 04/07/2017 0923   HGB 13.0 11/24/2019 1140   HGB 12.3 11/22/2015 0000   HCT 40.0 11/24/2019 1140   HCT 37 11/22/2015 0000   PLT 288 11/24/2019 1140   PLT 270 11/22/2015 0000   MCV 87 11/24/2019 1140   MCH 28.2 11/24/2019 1140   MCH 29.5 04/07/2017 0923   MCHC 32.5 11/24/2019 1140   MCHC 32.9 04/07/2017 0923   RDW 13.1 11/24/2019 1140   LYMPHSABS 1.6 11/24/2019 1140   MONOABS 728 01/20/2017 1414   EOSABS 0.1 11/24/2019 1140   BASOSABS 0.0 11/24/2019 1140  CMP     Component Value Date/Time   NA 136 05/16/2021 1652   K 4.0 05/16/2021 1652   CL 102 05/16/2021 1652   CO2 22 05/16/2021 1652   GLUCOSE 90 05/16/2021 1652   GLUCOSE 100 (H) 11/16/2018 1012   GLUCOSE 94 03/28/2016 1143   BUN 17 05/16/2021 1652   CREATININE 0.54 (L) 05/16/2021 1652   CREATININE 0.66 11/16/2018 1012   CALCIUM 8.8 05/16/2021 1652   PROT 8.0 11/24/2019 1140   ALBUMIN 4.8 11/24/2019 1140   AST 15 11/24/2019 1140   ALT 9 11/24/2019 1140   ALKPHOS 71 11/24/2019 1140   BILITOT 0.3 11/24/2019 1140   GFRNONAA 126 11/24/2019 1140   GFRNONAA  123 11/16/2018 1012   GFRAA 145 11/24/2019 1140   GFRAA 142 11/16/2018 1012     ASSESSMENT AND PLAN: 30 year old female with persistently positive H. pylori breath test despite treatment with triple therapy.  It is unclear if the patient has taken multiple rounds of the triple therapy versus just 1 time.  We discussed the significance of chronic H. pylori infection, to include increasing the risk for stomach cancer and peptic ulcer disease.  It is possible the H. pylori may also be contributing to her symptoms of bloating and gas.  I recommended we treat the infection with quadruple therapy.  We should check a stool antigen 4 weeks after completing therapy to confirm eradication. Quadruple therapy 1) Omeprazole 20 mg 2 times a day x 14 d 2) Pepto Bismol 2 tabs (262 mg each) 4 times a day x 14 d 3) Metronidazole 250 mg 4 times a day x 14 d 4) doxycycline 100 mg 2 times a day x 14 d  With regards to her hepatitis B, I informed her that hepatitis B needs to be monitored throughout her life, and that she may require medications if there is evidence that the virus is replicating and causing inflammation in her liver.  We also discussed increased risk of cirrhosis and liver cancer and chronic hepatitis B. We will get an updated viral load and aminotransferase level, and refer to infectious disease for ongoing chronic management of her hepatitis B.   H pylori -Quadruple therapy as described above - Stool antigen 4 weeks after completion of quadruple therapy  Chronic hepatitis B inactive, treatment nave, E antigen negative - Obtain viral load, liver associated enzymes - Refer to infectious disease for ongoing chronic management  Veverly Larimer E. Tomasa Rand, MD [facility]   CC: Hoy Register, MD

## 2021-09-27 LAB — HEPATITIS B DNA, ULTRAQUANTITATIVE, PCR
Hepatitis B DNA (Calc): 3.06 Log IU/mL — ABNORMAL HIGH
Hepatitis B DNA: 1160 IU/mL — ABNORMAL HIGH

## 2021-09-27 NOTE — Progress Notes (Signed)
[name], please let [name] know that her labs looked ok.  Her liver enzymes were completely normal, indicating that there is no damage currently being done to her liver from the virus.  The amount of virus in her blood has increased since it was last checked 3 years ago.  This will need to be monitored more closely by the Infectious Disease provider she was referred to.

## 2021-10-05 ENCOUNTER — Telehealth: Payer: Self-pay

## 2021-10-05 NOTE — Telephone Encounter (Signed)
Called patient Via interpreter line to inform her on lab results. Left VM via interpreter line for patient to call back.

## 2021-10-05 NOTE — Telephone Encounter (Signed)
-----   Message from Jenel Lucks, MD sent at 09/27/2021  5:37 PM EDT ----- Sterling Big, please let [name] know that her labs looked ok.  Her liver enzymes were completely normal, indicating that there is no damage currently being done to her liver from the virus.  The amount of virus in her blood has increased since it was last checked 3 years ago.  This will need to be monitored more closely by the Infectious Disease provider she was referred to.

## 2021-10-07 ENCOUNTER — Other Ambulatory Visit: Payer: Self-pay | Admitting: Gastroenterology

## 2021-10-09 NOTE — Telephone Encounter (Signed)
Called patient and informed her of lab result notes.

## 2021-10-10 ENCOUNTER — Encounter: Payer: Self-pay | Admitting: Gastroenterology

## 2021-10-15 ENCOUNTER — Ambulatory Visit: Payer: Self-pay | Admitting: *Deleted

## 2021-10-15 NOTE — Telephone Encounter (Signed)
Reason for Disposition  [1] MODERATE pain (e.g., interferes with normal activities) AND [2] pain comes and goes (cramps) AND [3] present > 24 hours  (Exception: pain with Vomiting or Diarrhea - see that Guideline)  Answer Assessment - Initial Assessment Questions 1. LOCATION: "Where does it hurt?"      Middle, upper 2. RADIATION: "Does the pain shoot anywhere else?" (e.g., chest, back)     Into chest 3. ONSET: "When did the pain begin?" (e.g., minutes, hours or days ago)       Finished med, back worse 4. SUDDEN: "Gradual or sudden onset?"     Gradually 5. PATTERN "Does the pain come and go, or is it constant?"    - If constant: "Is it getting better, staying the same, or worsening?"      (Note: Constant means the pain never goes away completely; most serious pain is constant and it progresses)     - If intermittent: "How long does it last?" "Do you have pain now?"     (Note: Intermittent means the pain goes away completely between bouts)      6. SEVERITY: "How bad is the pain?"  (e.g., Scale 1-10; mild, moderate, or severe)   - MILD (1-3): doesn't interfere with normal activities, abdomen soft and not tender to touch    - MODERATE (4-7): interferes with normal activities or awakens from sleep, abdomen tender to touch    - SEVERE (8-10): excruciating pain, doubled over, unable to do any normal activities      Nausea 7/10, Stomach pain 4-5/10. 7. RECURRENT SYMPTOM: "Have you ever had this type of stomach pain before?" If Yes, ask: "When was the last time?" and "What happened that time?"       8. CAUSE: "What do you think is causing the stomach pain?"      9. RELIEVING/AGGRAVATING FACTORS: "What makes it better or worse?" (e.g., movement, antacids, bowel movement)      10. OTHER SYMPTOMS: "Do you have any other symptoms?" (e.g., back pain, diarrhea, fever, urination pain, vomiting)       None  Protocols used: Abdominal Pain - Female-A-AH

## 2021-10-15 NOTE — Telephone Encounter (Signed)
Pt saw [facility] 09/25/21 "H. Pylori infection."  Pt reports has completed medication but since 3rd day of med has been nauseated. Also reports pain mid upper abdomen. Rates pain at 4-5/10, nausea 7/10. No vomiting "Except I did make myself throw up with my finger."  Denies any other symptoms. Assured pt NT would route to PCP for review. Advised to alert Dr. Tomasa Rand as he is following pt for issue. Advised ED for worsening symptoms. Pt states will call Dr. Rickey Primus provided.   Assisted by Interpreter # [phone]

## 2021-10-19 ENCOUNTER — Other Ambulatory Visit: Payer: Self-pay | Admitting: Gastroenterology

## 2021-11-04 ENCOUNTER — Encounter (HOSPITAL_COMMUNITY): Payer: Self-pay | Admitting: *Deleted

## 2021-11-04 ENCOUNTER — Other Ambulatory Visit: Payer: Self-pay

## 2021-11-04 ENCOUNTER — Emergency Department (HOSPITAL_COMMUNITY)
Admission: EM | Admit: 2021-11-04 | Discharge: 2021-11-04 | Disposition: A | Discharge status: Home / Self Care | Payer: Medicaid Other | Attending: Emergency Medicine | Admitting: Emergency Medicine

## 2021-11-04 DIAGNOSIS — J101 Influenza due to other identified influenza virus with other respiratory manifestations: Secondary | ICD-10-CM | POA: Diagnosis not present

## 2021-11-04 DIAGNOSIS — H9203 Otalgia, bilateral: Secondary | ICD-10-CM | POA: Insufficient documentation

## 2021-11-04 DIAGNOSIS — Z20822 Contact with and (suspected) exposure to covid-19: Secondary | ICD-10-CM | POA: Diagnosis not present

## 2021-11-04 DIAGNOSIS — J3489 Other specified disorders of nose and nasal sinuses: Secondary | ICD-10-CM | POA: Insufficient documentation

## 2021-11-04 DIAGNOSIS — R059 Cough, unspecified: Secondary | ICD-10-CM | POA: Diagnosis present

## 2021-11-04 LAB — RESP PANEL BY RT-PCR (FLU A&B, COVID) ARPGX2
Influenza A by PCR: POSITIVE — AB
Influenza B by PCR: NEGATIVE
SARS Coronavirus 2 by RT PCR: NEGATIVE

## 2021-11-04 MED ORDER — ACETAMINOPHEN 500 MG PO TABS
1000.0000 mg | ORAL_TABLET | Freq: Once | ORAL | Status: AC
  Administered 2021-11-04: 20:00:00 1000 mg via ORAL
  Filled 2021-11-04: qty 2

## 2021-11-04 NOTE — ED Triage Notes (Signed)
The pt has been ill for the past 3 days with cough cold runny nose And a lt earached  no temp  lmp   lmp due in the next few days

## 2021-11-04 NOTE — ED Provider Notes (Signed)
[facility] EMERGENCY DEPARTMENT Provider Note   CSN: [id number] Arrival date & time: 11/04/21  1701     History Chief Complaint  Patient presents with   URI    [name] is a 30 y.o. female.  [name] is a 30 y.o. female with history of hepatitis B, who presents to the ED for evaluation of chills, cough, congestion, headache, and body aches.  Symptoms started 3 days ago and have been persistent and worsening.  Patient also reports some bilateral ear pain.  Patient reports cough is productive of sputum.  Chest pain only present with cough.  No shortness of breath.  No abdominal pain, nausea or vomiting.  Patient reports that her son was diagnosed with flu last week, no other known sick contacts.  No other aggravating or alleviating factors.    The history is provided by the patient. The history is limited by a language barrier. A language interpreter was used.      Past Medical History:  Diagnosis Date   Gestational diabetes    Hepatitis B infection    +test in first pregancy    Patient Active Problem List   Diagnosis Date Noted   History of gestational diabetes 01/20/2017   Hepatitis B, chronic (HCC) 03/13/2016   Language barrier, cultural differences 03/13/2016    History reviewed. No pertinent surgical history.   OB History     Gravida  2   Para  2   Term  2   Preterm  0   AB  0   Living  2      SAB  0   IAB  0   Ectopic  0   Multiple      Live Births  2           No family history on file.  Social History   Tobacco Use   Smoking status: Never   Smokeless tobacco: Never  Substance Use Topics   Alcohol use: No   Drug use: No    Home Medications Prior to Admission medications   Medication Sig Start Date End Date Taking? Authorizing Provider  Bismuth 262 MG CHEW Chew 262 mg by mouth 4 (four) times daily. 09/25/21   Jenel Lucks, MD  doxycycline (VIBRAMYCIN) 100 MG capsule Take  1 capsule (100 mg total) by mouth 2 (two) times daily. 09/25/21   Jenel Lucks, MD  metroNIDAZOLE (FLAGYL) 250 MG tablet Take 1 tablet (250 mg total) by mouth 4 (four) times daily. 09/25/21   Jenel Lucks, MD  omeprazole (PRILOSEC) 20 MG capsule TAKE 1 CAPSULE(20 MG) BY MOUTH TWICE DAILY 10/19/21   Jenel Lucks, MD    Allergies    Patient has no known allergies.  Review of Systems   Review of Systems  Constitutional:  Positive for chills. Negative for fever.  HENT:  Positive for congestion, ear pain, rhinorrhea and sore throat.   Respiratory:  Positive for cough. Negative for shortness of breath.   Cardiovascular:  Negative for chest pain.  Gastrointestinal:  Negative for abdominal pain, diarrhea, nausea and vomiting.  Musculoskeletal:  Positive for myalgias. Negative for neck pain and neck stiffness.  Skin:  Negative for color change and rash.  Neurological:  Positive for headaches.  All other systems reviewed and are negative.  Physical Exam Updated Vital Signs BP 120/87   Pulse 88   Temp 98.6 F (37 C) (Oral)   Resp 20   Ht 5\' 1"  (  1.549 m)   Wt 63.5 kg   LMP 10/04/2021   SpO2 100%   BMI 26.45 kg/m   Physical Exam Vitals and nursing note reviewed.  Constitutional:      General: She is not in acute distress.    Appearance: She is well-developed. She is not ill-appearing or diaphoretic.  HENT:     Head: Normocephalic and atraumatic.     Right Ear: Tympanic membrane and ear canal normal.     Left Ear: Tympanic membrane and ear canal normal.     Nose: Congestion and rhinorrhea present.     Mouth/Throat:     Mouth: Mucous membranes are moist.     Pharynx: Oropharynx is clear.     Comments: Posterior oropharynx clear and mucous membranes moist, there is mild erythema but no edema or tonsillar exudates, uvula midline, normal phonation, no trismus, tolerating secretions without difficulty. Eyes:     General:        Right eye: No discharge.        Left  eye: No discharge.  Neck:     Comments: No rigidity Cardiovascular:     Rate and Rhythm: Normal rate and regular rhythm.     Heart sounds: Normal heart sounds. No murmur heard.   No friction rub. No gallop.  Pulmonary:     Effort: Pulmonary effort is normal. No respiratory distress.     Breath sounds: Normal breath sounds.     Comments: Respirations equal and unlabored, patient able to speak in full sentences, lungs clear to auscultation bilaterally  Abdominal:     General: Bowel sounds are normal. There is no distension.     Palpations: Abdomen is soft. There is no mass.     Tenderness: There is no abdominal tenderness. There is no guarding.     Comments: Abdomen soft, nondistended, nontender to palpation in all quadrants without guarding or peritoneal signs  Musculoskeletal:        General: No deformity.     Cervical back: Neck supple.  Lymphadenopathy:     Cervical: No cervical adenopathy.  Skin:    General: Skin is warm and dry.     Capillary Refill: Capillary refill takes less than 2 seconds.  Neurological:     Mental Status: She is alert and oriented to person, place, and time.  Psychiatric:        Mood and Affect: Mood normal.        Behavior: Behavior normal.    ED Results / Procedures / Treatments   Labs (all labs ordered are listed, but only abnormal results are displayed) Labs Reviewed  RESP PANEL BY RT-PCR (FLU A&B, COVID) ARPGX2 - Abnormal; Notable for the following components:      Result Value   Influenza A by PCR POSITIVE (*)    All other components within normal limits    EKG None  Radiology No results found.  Procedures Procedures   Medications Ordered in ED Medications  acetaminophen (TYLENOL) tablet 1,000 mg (1,000 mg Oral Given 11/04/21 1950)    ED Course  I have reviewed the triage vital signs and the nursing notes.  Pertinent labs & imaging results that were available during my care of the patient were reviewed by me and considered in  my medical decision making (see chart for details).    MDM Rules/Calculators/A&P                           Patient  with symptoms consistent with influenza.  Vitals are stable, low-grade fever. Improved with medication.  No signs of dehydration, tolerating PO's.  Lungs are clear.  No tachypnea or hypoxia, very low suspicion for pneumonia, do not feel that chest x-ray is indicated. Discussed the cost versus benefit of Tamiflu treatment with the patient.  The patient understands that symptoms are greater than the recommended 24-48 hour window of treatment, no tamiflu provided.  Patient will be discharged with instructions to orally hydrate, rest, and use over-the-counter medications for supportive care.  Discussed outpatient follow-up and return precautions.  Work note provided.  Patient expresses understanding and discharged home in good condition.    Final Clinical Impression(s) / ED Diagnoses Final diagnoses:  Influenza A    Rx / DC Orders ED Discharge Orders     None        Legrand Rams 11/05/21 0116    Terrilee Files, MD 11/05/21 1101

## 2021-11-04 NOTE — Discharge Instructions (Signed)
You have the flu this is a viral infection that will likely start to improve after 7-10 days, antibiotics are not helpful in treating viral infections.  Please make sure you are drinking plenty of fluids. You can treat your symptoms supportively with tylenol1000mg and ibuprofen 600 mg every 6 hours for fevers and pains. For nasal congestion you can use Zyrtec and Flonase to help with nasal congestion. To treat cough you can use over the counter cough medications such as Mucinex DM or Robitussin and throat lozenges. If your symptoms are not improving please follow up with you Primary doctor.   If you develop persistent fevers, shortness of breath or difficulty breathing, chest pain, severe headache and neck pain, persistent nausea and vomiting or other new or concerning symptoms return to the Emergency department. 

## 2021-11-04 NOTE — ED Notes (Signed)
Patient verbalizes understanding of discharge instructions. Opportunity for questioning and answers were provided. Armband removed by staff, pt discharged from ED ambulatory.   

## 2021-11-04 NOTE — ED Provider Notes (Signed)
Emergency Medicine Provider Triage Evaluation Note  [facility] Mcdade , a 30 y.o. female  was evaluated in triage.  Pt complains of feeling unwell.  Cough, congestion, runny nose, left ear pain x3 days.  Not taken thing for symptoms.  Review of Systems  Positive: Cough, congestion, rhinorrhea, ear pain Negative: Ever, emesis  Physical Exam  Ht 5\' 1"  (1.549 m)   Wt 63.5 kg   LMP 10/04/2021   BMI 26.45 kg/m  Gen:   Awake, no distress   Resp:  Normal effort  MSK:   Moves extremities without difficulty  Other:    Medical Decision Making  Medically screening exam initiated at 5:20 PM.  Appropriate orders placed.  [name] was informed that the remainder of the evaluation will be completed by another provider, this initial triage assessment does not replace that evaluation, and the importance of remaining in the ED until their evaluation is complete.  UR sx   [name] A, PA-C 11/04/21 1720    11/06/21, MD 11/04/21 2106

## 2021-11-13 ENCOUNTER — Other Ambulatory Visit: Payer: Medicaid Other

## 2021-11-13 ENCOUNTER — Ambulatory Visit (HOSPITAL_COMMUNITY)
Admission: EM | Admit: 2021-11-13 | Discharge: 2021-11-13 | Disposition: A | Discharge status: Home / Self Care | Payer: Medicaid Other | Attending: Family | Admitting: Family

## 2021-11-13 ENCOUNTER — Encounter (HOSPITAL_COMMUNITY): Payer: Self-pay | Admitting: Emergency Medicine

## 2021-11-13 ENCOUNTER — Ambulatory Visit: Payer: Self-pay

## 2021-11-13 ENCOUNTER — Other Ambulatory Visit: Payer: Self-pay

## 2021-11-13 DIAGNOSIS — M25562 Pain in left knee: Secondary | ICD-10-CM | POA: Diagnosis not present

## 2021-11-13 DIAGNOSIS — A048 Other specified bacterial intestinal infections: Secondary | ICD-10-CM

## 2021-11-13 DIAGNOSIS — B181 Chronic viral hepatitis B without delta-agent: Secondary | ICD-10-CM

## 2021-11-13 DIAGNOSIS — M25561 Pain in right knee: Secondary | ICD-10-CM | POA: Diagnosis not present

## 2021-11-13 MED ORDER — NAPROXEN 500 MG PO TABS: 500.0000 mg | ORAL_TABLET | Freq: Two times a day (BID) | ORAL | 0 refills | Status: DC | PRN

## 2021-11-13 NOTE — Discharge Instructions (Addendum)
Recommend start Naproxen 500mg  every 12 hours as needed for pain. Continue to monitor symptoms. If pain and numbness increase, go to the ER or call your PCP if pain does not improve within 3 to 4 days.

## 2021-11-13 NOTE — Telephone Encounter (Signed)
  Using Amharic interpreter # [phone], pt. Is asking for an appointment today. Friday started having pain, numbness and tingling to both lower legs. Mainly at night which keeps her from sleeping. No other symptoms. Please advise pt.    Answer Assessment - Initial Assessment Questions 1. ONSET: "When did the pain start?"      Friday 2. LOCATION: "Where is the pain located?"      Both legs 3. PAIN: "How bad is the pain?"    (Scale 1-10; or mild, moderate, severe)   -  MILD (1-3): doesn't interfere with normal activities    -  MODERATE (4-7): interferes with normal activities (e.g., work or school) or awakens from sleep, limping    -  SEVERE (8-10): excruciating pain, unable to do any normal activities, unable to walk     8 4. WORK OR EXERCISE: "Has there been any recent work or exercise that involved this part of the body?"      No 5. CAUSE: "What do you think is causing the leg pain?"     Unsure 6. OTHER SYMPTOMS: "Do you have any other symptoms?" (e.g., chest pain, back pain, breathing difficulty, swelling, rash, fever, numbness, weakness)     Numbness, tingling 7. PREGNANCY: "Is there any chance you are pregnant?" "When was your last menstrual period?"     No  Protocols used: Leg Pain-A-AH

## 2021-11-13 NOTE — ED Triage Notes (Signed)
Pt c/o bilat leg pain that radiated from ankles to knees but now radiating up to hips. Denies falls or injuries. Fir about week.

## 2021-11-13 NOTE — ED Provider Notes (Signed)
[facility]    CSN: [id number] Arrival date & time: 11/13/21  1021      History   Chief Complaint Chief Complaint  Patient presents with   Leg Pain    HPI [name] is a 30 y.o. female.   30 year old female presents with bilateral lower leg pain that started about 5 days ago. Originally diagnosed with Influenza A on 11/27 (10 days ago) with mainly respiratory complaints and some body aches. Cough has resolved. One week ago (11/29) went to the dentist and had a tooth infection and was placed on Amoxicillin for 7 days- finished the last dose today. Indicates the pain started near both of her ankles and will travel up both legs to her knees and now to her hips. No known injury or fall. No previous similar symptoms. Has not taken any medication for pain. Contacted her PCP who recommended she be seen at Urgent Care today. No other chronic health issues. Takes no daily medication.   The history is provided by the patient. The history is limited by a language barrier. A language interpreter was used (Used Amharic video interpreter- patient could understand some English).   Past Medical History:  Diagnosis Date   Gestational diabetes    Hepatitis B infection    +test in first pregancy    Patient Active Problem List   Diagnosis Date Noted   History of gestational diabetes 01/20/2017   Hepatitis B, chronic ([place]) 03/13/2016   Language barrier, cultural differences 03/13/2016    History reviewed. No pertinent surgical history.  OB History     Gravida  2   Para  2   Term  2   Preterm  0   AB  0   Living  2      SAB  0   IAB  0   Ectopic  0   Multiple      Live Births  2            Home Medications    Prior to Admission medications   Medication Sig Start Date End Date Taking? Authorizing Provider  naproxen (NAPROSYN) 500 MG tablet Take 1 tablet (500 mg total) by mouth every 12 (twelve) hours as needed for moderate pain. 11/13/21   Yes AmyotNicholes Stairs, NP  Bismuth 262 MG CHEW Chew 262 mg by mouth 4 (four) times daily. 09/25/21   Daryel November, MD  omeprazole (PRILOSEC) 20 MG capsule TAKE 1 CAPSULE(20 MG) BY MOUTH TWICE DAILY 10/19/21   Daryel November, MD    Family History No family history on file.  Social History Social History   Tobacco Use   Smoking status: Never   Smokeless tobacco: Never  Substance Use Topics   Alcohol use: No   Drug use: No     Allergies   Patient has no known allergies.   Review of Systems Review of Systems  Constitutional:  Negative for activity change, appetite change, chills, fatigue and fever.  HENT:  Positive for dental problem (recently on Amoxicillin). Negative for congestion, facial swelling, sore throat and trouble swallowing.   Eyes:  Negative for photophobia and visual disturbance.  Respiratory:  Positive for cough (resolving). Negative for chest tightness, shortness of breath and wheezing.   Cardiovascular:  Negative for chest pain and leg swelling.  Gastrointestinal:  Negative for nausea and vomiting.  Musculoskeletal:  Positive for myalgias. Negative for arthralgias, back pain, gait problem and joint swelling.  Skin:  Negative  for color change, pallor, rash and wound.  Allergic/Immunologic: Negative for environmental allergies, food allergies and immunocompromised state.  Neurological:  Positive for numbness (tingling of both legs). Negative for dizziness, tremors, seizures, syncope, weakness and light-headedness.  Hematological:  Negative for adenopathy. Does not bruise/bleed easily.    Physical Exam Triage Vital Signs ED Triage Vitals  Enc Vitals Group     BP 11/13/21 1220 120/78     Pulse Rate 11/13/21 1220 60     Resp 11/13/21 1220 17     Temp 11/13/21 1220 97.9 F (36.6 C)     Temp Source 11/13/21 1220 Oral     SpO2 11/13/21 1220 99 %     Weight --      Height --      Head Circumference --      Peak Flow --      Pain Score 11/13/21 1218  7     Pain Loc --      Pain Edu? --      Excl. in [employer]? --    No data found.  Updated Vital Signs BP 120/78 (BP Location: Right Arm)   Pulse 60   Temp 97.9 F (36.6 C) (Oral)   Resp 17   LMP 11/05/2021   SpO2 99%   Visual Acuity Right Eye Distance:   Left Eye Distance:   Bilateral Distance:    Right Eye Near:   Left Eye Near:    Bilateral Near:     Physical Exam Vitals and nursing note reviewed.  Constitutional:      General: She is awake. She is not in acute distress.    Appearance: She is well-developed and well-groomed.     Comments: Patient sitting  in the exam chair in no acute distress and will ambulate without difficulty.   HENT:     Head: Normocephalic and atraumatic.     Right Ear: Hearing normal.     Left Ear: Hearing normal.  Eyes:     Extraocular Movements: Extraocular movements intact.     Conjunctiva/sclera: Conjunctivae normal.  Cardiovascular:     Rate and Rhythm: Normal rate.  Pulmonary:     Effort: Pulmonary effort is normal.  Musculoskeletal:        General: No swelling or tenderness. Normal range of motion.     Cervical back: Normal range of motion.     Right knee: Normal. No swelling, effusion, erythema or ecchymosis. Normal range of motion. No tenderness. Normal alignment.     Left knee: Normal. No swelling, effusion, erythema or ecchymosis. Normal range of motion. No tenderness. Normal alignment.       Legs:     Comments: Patient indicates generalized pain in both knees. No redness, swelling or distinct tenderness on palpation of both legs, knees and thighs. No distinct neuro deficits noted. Good distal pulses and capillary refill.   Skin:    General: Skin is warm and dry.     Capillary Refill: Capillary refill takes less than 2 seconds.     Findings: No abrasion, bruising, ecchymosis, erythema, signs of injury, rash or wound.  Neurological:     Mental Status: She is alert and oriented to person, place, and time.     Sensory: Sensation is  intact. No sensory deficit.     Motor: Motor function is intact.     Gait: Gait is intact.  Psychiatric:        Mood and Affect: Mood normal.  Behavior: Behavior normal. Behavior is cooperative.        Thought Content: Thought content normal.        Judgment: Judgment normal.     UC Treatments / Results  Labs (all labs ordered are listed, but only abnormal results are displayed) Labs Reviewed - No data to display  EKG   Radiology No results found.  Procedures Procedures (including critical care time)  Medications Ordered in UC Medications - No data to display  Initial Impression / Assessment and Plan / UC Course  I have reviewed the triage vital signs and the nursing notes.  Pertinent labs & imaging results that were available during my care of the patient were reviewed by me and considered in my medical decision making (see chart for details).     Reviewed uncertain of cause of current symptoms but may be a post-viral (Influenza) joint pain. Recommend trial Naproxen 500mg  twice a day as needed for pain/inflammation. Continue to monitor symptoms. If pain and numbness increase, go to the ER ASAP. Otherwise, follow-up with her PCP if pain does not improve within 3 to 4 days.  Final Clinical Impressions(s) / UC Diagnoses   Final diagnoses:  Arthralgia of both lower legs     Discharge Instructions      Recommend start Naproxen 500mg  every 12 hours as needed for pain. Continue to monitor symptoms. If pain and numbness increase, go to the ER or call your PCP if pain does not improve within 3 to 4 days.     ED Prescriptions     Medication Sig Dispense Auth. Provider   naproxen (NAPROSYN) 500 MG tablet Take 1 tablet (500 mg total) by mouth every 12 (twelve) hours as needed for moderate pain. 20 tablet Zebastian Carico, , NP      PDMP not reviewed this encounter.   , NP 11/14/21 1022

## 2021-11-15 LAB — H. PYLORI ANTIGEN, STOOL: H pylori Ag, Stl: NEGATIVE

## 2021-11-20 NOTE — Progress Notes (Signed)
[name],  Can you please inform [name] that her H. Pylori stool test was negative, indicating that the bacteria has been eradicated.  No further treatment or evaluation is recommended.

## 2021-12-19 ENCOUNTER — Ambulatory Visit: Payer: Self-pay | Admitting: *Deleted

## 2021-12-19 NOTE — Telephone Encounter (Signed)
Summary: lumps in breast   Pt states that she has bubbles (lumps) in her right breast. Also her right hand is burning. Seeking clinical advice       Called patient via interpreter line ID [phone], to review lump in right breast and right hand burning. No answer, LVMTCB [phone].

## 2021-12-19 NOTE — Telephone Encounter (Signed)
°  Chief Complaint: R breast- lump Symptoms: lump in breast tissue above the nipple Frequency: 1 week Pertinent Negatives: Patient denies pain Disposition: [] ED /[] Urgent Care (no appt availability in office) / [x] Appointment(In office/virtual)/ []  [facility]/ [] Home Care/ [] Refused Recommended Disposition /[] [facility]/ []  Follow-up with PCP Additional Notes:     Reason for Disposition  Breast lump  Answer Assessment - Initial Assessment Questions 1. SYMPTOM: "What's the main symptom you're concerned about?"  (e.g., lump, pain, rash, nipple discharge)     R breast 2. LOCATION: "Where is the lump located?"     Top nipple area 3. ONSET: "When did lumps  start?"     Noticed 1 week ago 4. PRIOR HISTORY: "Do you have any history of prior problems with your breasts?" (e.g., lumps, cancer, fibrocystic breast disease)     no 5. CAUSE: "What do you think is causing this symptom?"     unsure 6. OTHER SYMPTOMS: "Do you have any other symptoms?" (e.g., fever, breast pain, redness or rash, nipple discharge)     Breast pain - previous- not now 7. PREGNANCY-BREASTFEEDING: "Is there any chance you are pregnant?" "When was your last menstrual period?" "Are you breastfeeding?"     No- regular- LMP- 2 weeks ago  Protocols used: Breast Symptoms-A-AH

## 2021-12-20 ENCOUNTER — Other Ambulatory Visit: Payer: Self-pay

## 2021-12-20 ENCOUNTER — Ambulatory Visit: Payer: Medicaid Other | Attending: Physician Assistant | Admitting: Physician Assistant

## 2021-12-20 VITALS — BP 114/75 | HR 91 | Ht 61.0 in | Wt 139.4 lb

## 2021-12-20 DIAGNOSIS — N6011 Diffuse cystic mastopathy of right breast: Secondary | ICD-10-CM | POA: Diagnosis not present

## 2021-12-20 DIAGNOSIS — Z603 Acculturation difficulty: Secondary | ICD-10-CM | POA: Diagnosis not present

## 2021-12-20 DIAGNOSIS — N644 Mastodynia: Secondary | ICD-10-CM | POA: Diagnosis not present

## 2021-12-20 NOTE — Progress Notes (Signed)
Lump in Right breast.

## 2021-12-20 NOTE — Progress Notes (Signed)
Patient ID: [name], female   DOB: December 15, 1990, 31 y.o.   MRN: [id number]     [name], is a 31 y.o. female  [id number]  [id number]  DOB - Dec 23, 1990  Chief Complaint  Patient presents with   Breast Mass       Subjective:   [name] is a 31 y.o. female here today for Burning on breast.  About 2 weeks ago noticed a lump in R breast.  The lump in the breast increases in tenderness and size and then decreases in size and is no longer tender.  LMP about 3 weeks ago.  She has not noticed this in the L breast.  Natnael is [employer] interpreter  Patient has No headache, No chest pain, No abdominal pain - No Nausea, No new weakness tingling or numbness, No Cough - SOB.  No problems updated.  ALLERGIES: No Known Allergies  PAST MEDICAL HISTORY: Past Medical History:  Diagnosis Date   Gestational diabetes    Hepatitis B infection    +test in first pregancy    MEDICATIONS AT HOME: Prior to Admission medications   Medication Sig Start Date End Date Taking? Authorizing Provider  Bismuth 262 MG CHEW Chew 262 mg by mouth 4 (four) times daily. 09/25/21  Yes Jenel Lucks, MD  naproxen (NAPROSYN) 500 MG tablet Take 1 tablet (500 mg total) by mouth every 12 (twelve) hours as needed for moderate pain. 11/13/21  Yes Amyot, Ali Lowe, NP  omeprazole (PRILOSEC) 20 MG capsule TAKE 1 CAPSULE(20 MG) BY MOUTH TWICE DAILY 10/19/21  Yes Jenel Lucks, MD    ROS: Neg HEENT Neg resp Neg cardiac Neg GI Neg GU Neg MS Neg psych Neg neuro  Objective:   Vitals:   12/20/21 1409  BP: 114/75  Pulse: 91  SpO2: 100%  Weight: 139 lb 6.4 oz (63.2 kg)  Height: 5\' 1"  (1.549 m)   Exam General appearance : Awake, alert, not in any distress. Speech Clear. Not toxic looking HEENT: Atraumatic and Normocephalic Neck: Supple, no JVD. No cervical lymphadenopathy.  Chest: Good air entry bilaterally, CTAB.  No rales/rhonchi/wheezing CVS: S1 S2 regular, no  murmurs.  B breasts examined.  Large pendulous breasts-neither have skin changes.  L breast and axilla without any discreet nodules/lumps.  R breast-there is a <1cm freely mobile dense tissue at 12 o'clock about 1 cm above the areola. Is is slightly TTP.  No axillary areas of concern Extremities: B/L Lower Ext shows no edema, both legs are warm to touch Neurology: Awake alert, and oriented X 3, CN II-XII intact, Non focal Skin: No Rash  Data Review Lab Results  Component Value Date   HGBA1C 5.5 05/16/2021   HGBA1C 5.6 11/24/2019   HGBA1C 5.3 04/13/2018    Assessment & Plan   1. Breast tenderness in female Likely just hormonal changes related to menstrual cycle - 06/13/2018 BREAST ASPIRATION RIGHT; Future  2. Fibrocystic disease of right breast Continue SBE monthly - US BREAST ASPIRATION RIGHT; Future  3. Language barrier, cultural differences [employer] interpreters "Natnael" used and additional time performing visit was required.   Patient have been counseled extensively about nutrition and exercise. Other issues discussed during this visit include: low cholesterol diet, weight control and daily exercise, foot care, annual eye examinations at Ophthalmology, importance of adherence with medications and regular follow-up. We also discussed long term complications of uncontrolled diabetes and hypertension.   Return if symptoms worsen or fail to improve.  The patient was given clear  instructions to go to ER or return to medical center if symptoms don't improve, worsen or new problems develop. The patient verbalized understanding. The patient was told to call to get lab results if they haven't heard anything in the next week.      Georgian Co, PA-C [facility] [place], [place] [phone]   12/20/2021, 2:22 PM

## 2022-01-02 ENCOUNTER — Other Ambulatory Visit: Payer: Medicaid Other

## 2022-01-02 ENCOUNTER — Other Ambulatory Visit: Payer: Self-pay | Admitting: Physician Assistant

## 2022-01-02 DIAGNOSIS — N644 Mastodynia: Secondary | ICD-10-CM

## 2022-01-02 DIAGNOSIS — N6011 Diffuse cystic mastopathy of right breast: Secondary | ICD-10-CM

## 2022-01-11 ENCOUNTER — Other Ambulatory Visit: Payer: Self-pay

## 2022-01-11 ENCOUNTER — Ambulatory Visit (INDEPENDENT_AMBULATORY_CARE_PROVIDER_SITE_OTHER): Payer: Medicaid Other

## 2022-01-11 ENCOUNTER — Ambulatory Visit (HOSPITAL_COMMUNITY)
Admission: EM | Admit: 2022-01-11 | Discharge: 2022-01-11 | Disposition: A | Discharge status: Home / Self Care | Payer: Medicaid Other | Attending: Family Medicine | Admitting: Family Medicine

## 2022-01-11 ENCOUNTER — Encounter (HOSPITAL_COMMUNITY): Payer: Self-pay | Admitting: Emergency Medicine

## 2022-01-11 DIAGNOSIS — M25521 Pain in right elbow: Secondary | ICD-10-CM

## 2022-01-11 MED ORDER — IBUPROFEN 800 MG PO TABS: 800.0000 mg | ORAL_TABLET | Freq: Three times a day (TID) | ORAL | 0 refills | Status: DC | PRN

## 2022-01-11 NOTE — ED Provider Notes (Signed)
[place]    CSN: [id number] Arrival date & time: 01/11/22  1225      History   Chief Complaint Chief Complaint  Patient presents with   Arm Pain    HPI [name] is a 31 y.o. female.    Arm Pain  Here for a 1 month history of right elbow pain.  It is gotten worse in the last week when she picks up objects it hurts her more in her right elbow.  This pain is centered over the olecranon process and is not over the lateral epicondyle.  No rash and no fever.  She does note it itches there sometimes. No numbness or tingling in the hand  Past Medical History:  Diagnosis Date   Gestational diabetes    Hepatitis B infection    +test in first pregancy    Patient Active Problem List   Diagnosis Date Noted   History of gestational diabetes 01/20/2017   Hepatitis B, chronic ([place]) 03/13/2016   Language barrier, cultural differences 03/13/2016    History reviewed. No pertinent surgical history.  OB History     Gravida  2   Para  2   Term  2   Preterm  0   AB  0   Living  2      SAB  0   IAB  0   Ectopic  0   Multiple      Live Births  2            Home Medications    Prior to Admission medications   Medication Sig Start Date End Date Taking? Authorizing Provider  ibuprofen (ADVIL) 800 MG tablet Take 1 tablet (800 mg total) by mouth every 8 (eight) hours as needed (pain). 01/11/22  Yes Barrett Henle, MD    Family History No family history on file.  Social History Social History   Tobacco Use   Smoking status: Never   Smokeless tobacco: Never  Substance Use Topics   Alcohol use: No   Drug use: No     Allergies   Patient has no known allergies.   Review of Systems Review of Systems   Physical Exam Triage Vital Signs ED Triage Vitals  Enc Vitals Group     BP 01/11/22 1332 112/76     Pulse Rate 01/11/22 1332 73     Resp 01/11/22 1332 16     Temp 01/11/22 1332 98.3 F (36.8 C)     Temp Source  01/11/22 1332 Oral     SpO2 01/11/22 1332 100 %     Weight --      Height --      Head Circumference --      Peak Flow --      Pain Score 01/11/22 1330 8     Pain Loc --      Pain Edu? --      Excl. in [place]? --    No data found.  Updated Vital Signs BP 112/76 (BP Location: Left Arm)    Pulse 73    Temp 98.3 F (36.8 C) (Oral)    Resp 16    LMP 12/27/2021    SpO2 100%   Visual Acuity Right Eye Distance:   Left Eye Distance:   Bilateral Distance:    Right Eye Near:   Left Eye Near:    Bilateral Near:     Physical Exam Vitals reviewed.  Constitutional:      General:  She is not in acute distress.    Appearance: She is not ill-appearing, toxic-appearing or diaphoretic.  Cardiovascular:     Rate and Rhythm: Normal rate and regular rhythm.     Heart sounds: No murmur heard. Pulmonary:     Effort: Pulmonary effort is normal.     Breath sounds: Normal breath sounds.  Musculoskeletal:        General: Tenderness (right olecranon process. No swelling or erythema or rash there.) present.  Skin:    Coloration: Skin is not pale.  Neurological:     Mental Status: She is alert.     UC Treatments / Results  Labs (all labs ordered are listed, but only abnormal results are displayed) Labs Reviewed - No data to display  EKG   Radiology DG Elbow Complete Right  Result Date: 01/11/2022 CLINICAL DATA:  Right elbow pain over the olecranon process with lifting for the past month. No known injury. EXAM: RIGHT ELBOW - COMPLETE 3+ VIEW COMPARISON:  None. FINDINGS: The mineralization and alignment are normal. There is no evidence of acute fracture or dislocation. The joint spaces are preserved. No elbow joint effusion or focal soft tissue abnormality identified. IMPRESSION: Normal examination. Electronically Signed   By: Richardean Sale M.D.   On: 01/11/2022 14:31    Procedures Procedures (including critical care time)  Medications Ordered in UC Medications - No data to  display  Initial Impression / Assessment and Plan / UC Course  I have reviewed the triage vital signs and the nursing notes.  Pertinent labs & imaging results that were available during my care of the patient were reviewed by me and considered in my medical decision making (see chart for details).     Xray is negative. Ibuprofen in the short term. Info on orthopedics given. Final Clinical Impressions(s) / UC Diagnoses   Final diagnoses:  Right elbow pain     Discharge Instructions      Your x-ray did not show any bony problem.  Take ibuprofen 800 mg 1 every 8 hours as needed for pain.  Call the orthopedic office listed on your discharge paperwork if not improving     ED Prescriptions     Medication Sig Dispense Auth. Provider   ibuprofen (ADVIL) 800 MG tablet Take 1 tablet (800 mg total) by mouth every 8 (eight) hours as needed (pain). 21 tablet Emillee Talsma, Gwenlyn Perking, MD      PDMP not reviewed this encounter.   Barrett Henle, MD 01/11/22 [phone]

## 2022-01-11 NOTE — ED Triage Notes (Signed)
Pt c/o right arm pain esp with lifting for past month but as gotten worse over past week. Denies injury

## 2022-01-11 NOTE — Discharge Instructions (Signed)
Your x-ray did not show any bony problem.  Take ibuprofen 800 mg 1 every 8 hours as needed for pain.  Call the orthopedic office listed on your discharge paperwork if not improving

## 2022-01-29 DIAGNOSIS — M7711 Lateral epicondylitis, right elbow: Secondary | ICD-10-CM | POA: Diagnosis not present

## 2022-01-30 ENCOUNTER — Ambulatory Visit
Admission: RE | Admit: 2022-01-30 | Discharge: 2022-01-30 | Disposition: A | Discharge status: Home / Self Care | Payer: Medicaid Other | Source: Ambulatory Visit | Attending: Physician Assistant | Admitting: Physician Assistant

## 2022-01-30 DIAGNOSIS — N6011 Diffuse cystic mastopathy of right breast: Secondary | ICD-10-CM

## 2022-01-30 DIAGNOSIS — N6489 Other specified disorders of breast: Secondary | ICD-10-CM | POA: Diagnosis not present

## 2022-01-30 DIAGNOSIS — N644 Mastodynia: Secondary | ICD-10-CM

## 2022-03-31 IMAGING — US US BREAST*R* LIMITED INC AXILLA
1 series · 2 of 2 positions shown · non-contrast
Comparison: None.

CLINICAL DATA: Patient has a palpable mass in the RIGHT breast
which comes and goes.

EXAM:
ULTRASOUND OF THE RIGHT BREAST

[Series 1: us breast*right* limited inc axilla · 0.06mm/px · 2 of 2 slices shown]
[im 1/2]
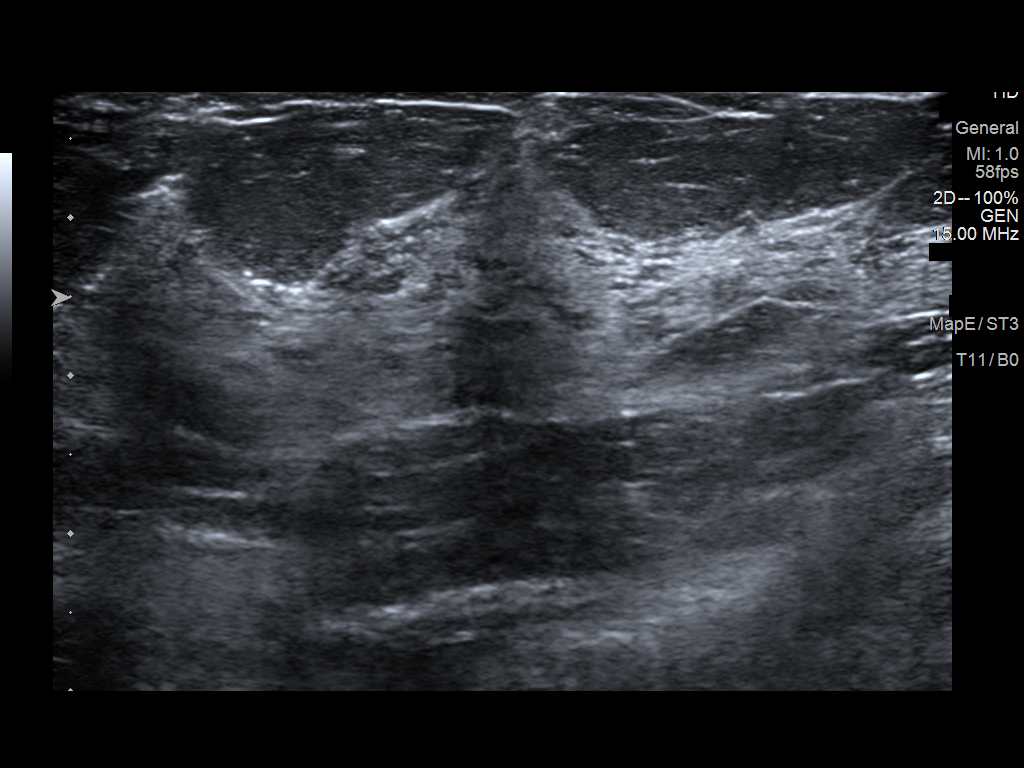
[im 2/2]
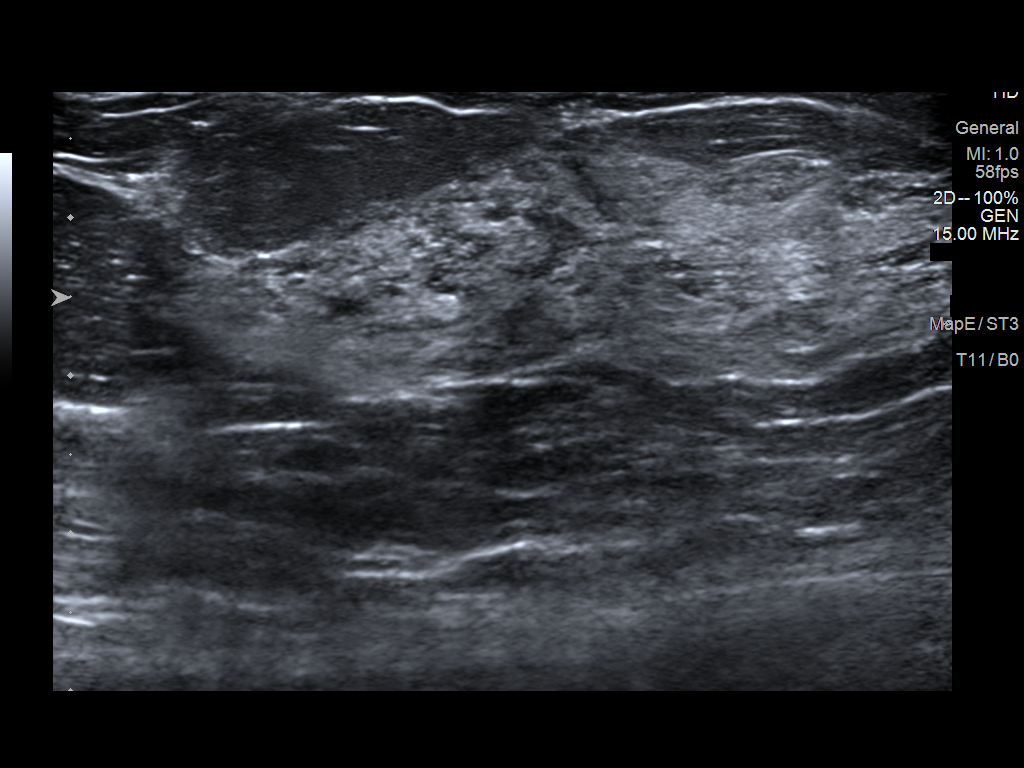

[2 of 2 positions shown; findings below may reference images not displayed]

FINDINGS: On physical exam, I palpate a soft area of thickening in the 12
o'clock location of the RIGHT breast. I palpate no discrete mass in
this region.

Targeted ultrasound is performed, showing normal appearing dense
fibroglandular tissue in the 12 o'clock location of the RIGHT breast
4 centimeters from the nipple. A dense fibroglandular ridge likely
accounts for the palpable abnormality. No suspicious mass,
distortion, or acoustic shadowing is demonstrated with ultrasound.
IMPRESSION: There is no sonographic evidence for malignancy.

RECOMMENDATION:
Recommend screening mammogram at age 40 unless there are persistent
or intervening clinical concerns. (Code:U7-6-KF3)

I have discussed the findings and recommendations with the patient.
If applicable, a reminder letter will be sent to the patient
regarding the next appointment.

BI-RADS CATEGORY  1: Negative.

## 2022-06-18 ENCOUNTER — Ambulatory Visit: Payer: Self-pay | Admitting: *Deleted

## 2022-06-18 NOTE — Telephone Encounter (Signed)
  Chief Complaint: breast and abdominal pain Symptoms: had for months, saw Dr Alvis Lemmings for it one time Frequency: constant Pertinent Negatives: Patient denies fever, vomiting, nausea Disposition: [] ED /[] Urgent Care (no appt availability in office) / [x] Appointment(In office/virtual)/ []  [facility]/ [] Home Care/ [] Refused Recommended Disposition /[x] [facility]/ []  Follow-up with PCP Additional Notes: Interpreter used [phone], I made pt appt in 3 weeks. I gave her information on the mobile bus, it will be near her tomorrow. Pt states she is going to wait. She talked about breast pain also, in both breasts the same. She was sent for breast and it was negative. Advised to get mammogram starting at age 35. She denied any swelling.  Reason for Disposition  [1] MILD-MODERATE pain AND [2] comes and goes (cramps)  Answer Assessment - Initial Assessment Questions 1. LOCATION: "Where does it hurt?"      Upper abd and breast, both 2. RADIATION: "Does the pain shoot anywhere else?" (e.g., chest, back)     no 3. ONSET: "When did the pain begin?" (e.g., minutes, hours or days ago)      Breast hurt two weeks, abd a long time, has seen md for that 4. SUDDEN: "Gradual or sudden onset?"     Same for two weeks 5. PATTERN "Does the pain come and go, or is it constant?"    - If constant: "Is it getting better, staying the same, or worsening?"      (Note: Constant means the pain never goes away completely; most serious pain is constant and it progresses)     - If intermittent: "How long does it last?" "Do you have pain now?"     (Note: Intermittent means the pain goes away completely between bouts)     Just to touch it hurts 6. SEVERITY: "How bad is the pain?"  (e.g., Scale 1-10; mild, moderate, or severe)   - MILD (1-3): doesn't interfere with normal activities, abdomen soft and not tender to touch    - MODERATE (4-7): interferes with normal activities or awakens from sleep, abdomen  tender to touch    - SEVERE (8-10): excruciating pain, doubled over, unable to do any normal activities      7 7. RECURRENT SYMPTOM: "Have you ever had this type of stomach pain before?" If Yes, ask: "When was the last time?" and "What happened that time?"      Had this before 8. CAUSE: "What do you think is causing the stomach pain?"     unknown 9. RELIEVING/AGGRAVATING FACTORS: "What makes it better or worse?" (e.g., movement, antacids, bowel movement)     No change with bowel movement 10. OTHER SYMPTOMS: "Do you have any other symptoms?" (e.g., back pain, diarrhea, fever, urination pain, vomiting)       no 11. PREGNANCY: "Is there any chance you are pregnant?" "When was your last menstrual period?"       no  Protocols used: Abdominal Pain - [facility]

## 2022-06-19 NOTE — Telephone Encounter (Signed)
Noted  

## 2022-07-11 ENCOUNTER — Ambulatory Visit: Payer: Medicaid Other | Admitting: Physician Assistant

## 2022-07-31 ENCOUNTER — Ambulatory Visit: Payer: Medicaid Other | Attending: Physician Assistant | Admitting: Family Medicine

## 2022-07-31 ENCOUNTER — Encounter: Payer: Self-pay | Admitting: Family Medicine

## 2022-07-31 VITALS — BP 114/74 | HR 81 | Temp 98.1°F | Resp 15 | Ht 61.0 in | Wt 145.0 lb

## 2022-07-31 DIAGNOSIS — Z131 Encounter for screening for diabetes mellitus: Secondary | ICD-10-CM | POA: Diagnosis not present

## 2022-07-31 DIAGNOSIS — R140 Abdominal distension (gaseous): Secondary | ICD-10-CM | POA: Diagnosis not present

## 2022-07-31 DIAGNOSIS — N644 Mastodynia: Secondary | ICD-10-CM | POA: Diagnosis not present

## 2022-07-31 NOTE — Progress Notes (Signed)
Subjective:  Patient ID: [name], female    DOB: [birth year]  Age: 31 y.o. MRN: [id number]  CC: Breast Pain (Touching breasts, putting on bra was painful/Pain lasted for 3 weeks./Says breast pain is not as severe but is still there. /Intermittent stomach pain, not currently experiencing pain)   HPI [name] is a 31 y.o. year old female with a history of gestational diabetes mellitus.  Interval History: Complains of periumbilical pain for over 4 years not associated with nausea, vomiting, diarrhea, constipation and is intermittent. Described as a bad cramp and at other times a sharp stabbing pain and is unrelated to meals; she sometimes has bloating and when she passes gas it is very smelly. She does have a previous history of H. pylori gastritis for which she was treated and subsequently referred to GI where she finally had a complete eradication in 11/2021.  Also Complains of bilateral breast pain in which was tender to touch, clothes and this has improved but was present when she made the appointment over a month ago and lasted for 3 weeks. Review of her chat indicates she had a visit with the PA 7 months ago for breast pain and right breast Ultrasound was negative. LMP 07/05/22. She is not sexually active. She is concerned because she has a FHx of breast ca in her sister diagnosed at the age of 27.  Past Medical History:  Diagnosis Date   Gestational diabetes    Hepatitis B infection    +test in first pregancy    History reviewed. No pertinent surgical history.  History reviewed. No pertinent family history.  Social History   Socioeconomic History   Marital status: Married    Spouse name: Not on file   Number of children: Not on file   Years of education: Not on file   Highest education level: Not on file  Occupational History   Not on file  Tobacco Use   Smoking status: Never   Smokeless tobacco: Never  Substance and Sexual Activity    Alcohol use: No   Drug use: No   Sexual activity: Not Currently    Birth control/protection: None  Other Topics Concern   Not on file  Social History Narrative   ** Merged History Encounter **       Social Determinants of Health   Financial Resource Strain: Not on file  Food Insecurity: Not on file  Transportation Needs: Not on file  Physical Activity: Not on file  Stress: Not on file  Social Connections: Not on file    No Known Allergies  Outpatient Medications Prior to Visit  Medication Sig Dispense Refill   ibuprofen (ADVIL) 800 MG tablet Take 1 tablet (800 mg total) by mouth every 8 (eight) hours as needed (pain). (Patient not taking: Reported on 07/31/2022) 21 tablet 0   No facility-administered medications prior to visit.     ROS Review of Systems  Constitutional:  Negative for activity change, appetite change and fatigue.  HENT:  Negative for congestion, sinus pressure and sore throat.   Eyes:  Negative for visual disturbance.  Respiratory:  Negative for cough, chest tightness, shortness of breath and wheezing.   Cardiovascular:  Negative for chest pain and palpitations.  Gastrointestinal:        See HPI  Endocrine: Negative for polydipsia.  Genitourinary:  Negative for dysuria and frequency.  Musculoskeletal:  Negative for arthralgias and back pain.  Skin:  Negative for rash.  Neurological:  Negative for  tremors, light-headedness and numbness.  Hematological:  Does not bruise/bleed easily.  Psychiatric/Behavioral:  Negative for agitation and behavioral problems.     Objective:  BP 114/74 (BP Location: Left Arm, Patient Position: Sitting, Cuff Size: Normal)   Pulse 81   Temp 98.1 F (36.7 C)   Resp 15   Ht 5\' 1"  (1.549 m)   Wt 145 lb (65.8 kg)   SpO2 99%   BMI 27.40 kg/m      07/31/2022   11:21 AM 01/11/2022    1:32 PM 12/20/2021    2:09 PM  BP/Weight  Systolic BP 114 112 114  Diastolic BP 74 76 75  Wt. (Lbs) 145  139.4  BMI 27.4 kg/m2  26.34  kg/m2      Physical Exam Exam conducted with a chaperone present.  Constitutional:      Appearance: She is well-developed.  Cardiovascular:     Rate and Rhythm: Normal rate.     Heart sounds: Normal heart sounds. No murmur heard. Pulmonary:     Effort: Pulmonary effort is normal.     Breath sounds: Normal breath sounds. No wheezing or rales.  Chest:     Chest wall: No tenderness.  Breasts:    Right: No mass or tenderness.     Left: No mass or tenderness.  Abdominal:     General: Bowel sounds are normal. There is no distension.     Palpations: Abdomen is soft. There is no mass.     Tenderness: There is no abdominal tenderness.  Musculoskeletal:        General: Normal range of motion.     Right lower leg: No edema.     Left lower leg: No edema.  Neurological:     Mental Status: She is alert and oriented to person, place, and time.  Psychiatric:        Mood and Affect: Mood normal.        Latest Ref Rng & Units 09/25/2021   11:23 AM 05/16/2021    4:52 PM 11/24/2019   11:40 AM  CMP  Glucose 65 - 99 mg/dL  90  97   BUN 6 - 20 mg/dL  17  7   Creatinine 11/26/2019 - 1.00 mg/dL  5.17  6.16   Sodium 0.73 - 144 mmol/L  136  138   Potassium 3.5 - 5.2 mmol/L  4.0  4.4   Chloride 96 - 106 mmol/L  102  103   CO2 20 - 29 mmol/L  22  19   Calcium 8.7 - 10.2 mg/dL  8.8  9.4   Total Protein 6.0 - 8.3 g/dL 8.2   8.0   Total Bilirubin 0.2 - 1.2 mg/dL 0.3   0.3   Alkaline Phos 39 - 117 U/L 47   71   AST 0 - 37 U/L 14   15   ALT 0 - 35 U/L 13   9     Lipid Panel     Component Value Date/Time   CHOL 150 10/14/2016 0912   TRIG 32 10/14/2016 0912   HDL 52 10/14/2016 0912   CHOLHDL 2.9 10/14/2016 0912   VLDL 6 10/14/2016 0912   LDLCALC 92 10/14/2016 0912    CBC    Component Value Date/Time   WBC 9.2 11/24/2019 1140   WBC 8.6 04/07/2017 0923   RBC 4.61 11/24/2019 1140   RBC 4.40 04/07/2017 0923   HGB 13.0 11/24/2019 1140   HGB 12.3 11/22/2015 0000   HCT 40.0  11/24/2019 1140    HCT 37 11/22/2015 0000   PLT 288 11/24/2019 1140   PLT 270 11/22/2015 0000   MCV 87 11/24/2019 1140   MCH 28.2 11/24/2019 1140   MCH 29.5 04/07/2017 0923   MCHC 32.5 11/24/2019 1140   MCHC 32.9 04/07/2017 0923   RDW 13.1 11/24/2019 1140   LYMPHSABS 1.6 11/24/2019 1140   MONOABS 728 01/20/2017 1414   EOSABS 0.1 11/24/2019 1140   BASOSABS 0.0 11/24/2019 1140    Lab Results  Component Value Date   HGBA1C 5.5 05/16/2021    Assessment & Plan:  1. Abdominal bloating Given previous history of H. pylori we will need to check this again Commence on PPI if it is negative - H. pylori breath test - Basic Metabolic Panel - CBC with Differential/Platelet  2. Breast tenderness in female Symptoms are absent at the moment Right breast ultrasound negative for malignancy in 01/2022 Advised to keep a symptom diary as breast pain could be coinciding with ovulation and menstrual cycle Given family history and her concern I will follow-up with her in 3 months regarding her symptoms  3. Screening for diabetes mellitus History of gestational DM - Hemoglobin A1c    No orders of the defined types were placed in this encounter.   Follow-up: Return in about 3 months (around 10/31/2022) for breast pain.       Hoy Register, MD, FAAFP. [facility] [place], [place] [phone]   07/31/2022, 12:21 PM

## 2022-07-31 NOTE — Patient Instructions (Signed)
Breast Tenderness Breast tenderness is a common problem for women of all ages, but may also occur in men. Breast tenderness has many possible causes, including hormone changes, infections, taking certain medicines, and caffeine intake. In women, the pain usually comes and goes with the menstrual cycle, but it can also be constant. Breast tenderness may range from mild discomfort to severe pain. You may have tests, such as a mammogram or an ultrasound, to check for any unusual findings. Having breast tenderness usually does not mean that you have breast cancer. Follow these instructions at home: Managing pain and discomfort  If directed, put ice on the painful area. To do this: Put ice in a plastic bag. Place a towel between your skin and the bag. Leave the ice on for 20 minutes, 2-3 times a day. If your skin turns bright red, remove the ice right away to prevent skin damage. The risk of skin damage is higher if you cannot feel pain, heat, or cold. Wear a supportive bra or chest support: During exercise. While sleeping, if your breasts are very tender. Medicines Take over-the-counter and prescription medicines only as told by your health care provider. If the cause of your pain is an infection, you may be prescribed an antibiotic medicine. If you were prescribed antibiotics, take them as told by your health care provider. Do not stop using the antibiotic even if you start to feel better. Eating and drinking Decrease the amount of caffeine in your diet. Instead, drink more water and choose caffeine-free drinks. Your health care provider may recommend that you lessen the amount of fat in your diet. You can do this by: Limiting fried foods. Cooking foods using methods such as baking, boiling, grilling, and broiling. General instructions  Keep a log of the days and times when your breasts are most tender. Ask your health care provider how to do breast exams at home. This will help you notice if  you have an unusual growth or lump. Keep all follow-up visits. Contact a health care provider if: Any part of your breast is hard, red, and hot to the touch. This may be a sign of infection. You are a woman and have a new or painful lump in your breast that remains after your menstrual period ends. You are not breastfeeding and you have fluid, especially blood or pus, coming out of your nipples. You have a fever. Your pain does not improve or it gets worse. Your pain is interfering with your daily activities. Summary Breast tenderness may range from mild discomfort to severe pain. Breast tenderness has many possible causes, including hormone changes, infections, taking certain medicines, and caffeine intake. It can be treated with ice, wearing a supportive bra or chest support, and medicines. Make changes to your diet as told by your health care provider. This information is not intended to replace advice given to you by your health care provider. Make sure you discuss any questions you have with your health care provider. Document Revised: 02/06/2022 Document Reviewed: 02/06/2022 Elsevier Patient Education  2023 [employer]  

## 2022-08-01 LAB — HEMOGLOBIN A1C
Est. average glucose Bld gHb Est-mCnc: 123 mg/dL
Hgb A1c MFr Bld: 5.9 % — ABNORMAL HIGH (ref 4.8–5.6)

## 2022-08-01 LAB — BASIC METABOLIC PANEL
BUN/Creatinine Ratio: 22 (ref 9–23)
BUN: 10 mg/dL (ref 6–20)
CO2: 21 mmol/L (ref 20–29)
Calcium: 9.3 mg/dL (ref 8.7–10.2)
Chloride: 102 mmol/L (ref 96–106)
Creatinine, Ser: 0.46 mg/dL — ABNORMAL LOW (ref 0.57–1.00)
Glucose: 130 mg/dL — ABNORMAL HIGH (ref 70–99)
Potassium: 4.4 mmol/L (ref 3.5–5.2)
Sodium: 139 mmol/L (ref 134–144)
eGFR: 133 mL/min/{1.73_m2} (ref >59)

## 2022-08-01 LAB — CBC WITH DIFFERENTIAL/PLATELET
Basophils Absolute: 0 10*3/uL (ref 0.0–0.2)
Basos: 0 %
EOS (ABSOLUTE): 0.1 10*3/uL (ref 0.0–0.4)
Eos: 2 %
Hematocrit: 39.2 % (ref 34.0–46.6)
Hemoglobin: 13.3 g/dL (ref 11.1–15.9)
Immature Grans (Abs): 0 10*3/uL (ref 0.0–0.1)
Immature Granulocytes: 0 %
Lymphocytes Absolute: 1.7 10*3/uL (ref 0.7–3.1)
Lymphs: 24 %
MCH: 29.2 pg (ref 26.6–33.0)
MCHC: 33.9 g/dL (ref 31.5–35.7)
MCV: 86 fL (ref 79–97)
Monocytes Absolute: 0.7 10*3/uL (ref 0.1–0.9)
Monocytes: 9 %
Neutrophils Absolute: 4.8 10*3/uL (ref 1.4–7.0)
Neutrophils: 65 %
Platelets: 262 10*3/uL (ref 150–450)
RBC: 4.55 x10E6/uL (ref 3.77–5.28)
RDW: 13.5 % (ref 11.7–15.4)
WBC: 7.3 10*3/uL (ref 3.4–10.8)

## 2022-08-02 ENCOUNTER — Other Ambulatory Visit: Payer: Self-pay | Admitting: Family Medicine

## 2022-08-02 LAB — H. PYLORI BREATH TEST: H pylori Breath Test: NEGATIVE

## 2022-08-02 MED ORDER — OMEPRAZOLE 40 MG PO CPDR: 40.0000 mg | DELAYED_RELEASE_CAPSULE | Freq: Every day | ORAL | 3 refills | Status: DC

## 2022-08-07 ENCOUNTER — Ambulatory Visit: Payer: Medicaid Other | Admitting: Physician Assistant

## 2022-10-30 ENCOUNTER — Ambulatory Visit: Payer: Medicaid Other | Admitting: Family Medicine

## 2023-08-20 ENCOUNTER — Ambulatory Visit: Payer: Medicaid Other | Admitting: Family Medicine

## 2023-10-21 ENCOUNTER — Ambulatory Visit: Payer: Medicaid Other | Attending: Family Medicine | Admitting: Family Medicine

## 2023-10-21 ENCOUNTER — Encounter: Payer: Self-pay | Admitting: Family Medicine

## 2023-10-21 VITALS — BP 142/85 | HR 85 | Ht 61.0 in | Wt 152.6 lb

## 2023-10-21 DIAGNOSIS — Z0001 Encounter for general adult medical examination with abnormal findings: Secondary | ICD-10-CM | POA: Diagnosis not present

## 2023-10-21 DIAGNOSIS — R7303 Prediabetes: Secondary | ICD-10-CM | POA: Diagnosis not present

## 2023-10-21 DIAGNOSIS — L918 Other hypertrophic disorders of the skin: Secondary | ICD-10-CM

## 2023-10-21 NOTE — Patient Instructions (Signed)
Skin Tag, Adult A skin tag (acrochordon) is a soft, extra growth of skin. Most skin tags are skin-colored and rarely bigger than a pencil eraser. They often form in areas where there is frequent rubbing, or friction, on the skin. This may be where there are folds in the skin, such as: The eyelids. The neck. The armpits. The groin. Skin tags are not dangerous, and they do not spread from person to person (are not contagious). You may have one skin tag or many. Skin tags do not need treatment. However, your health care provider may recommend removing a skin tag if it: Gets irritated from clothing or jewelry. Bleeds. Is visible and unsightly. What are the causes? This condition is linked to: Increasing age. Pregnancy. Diabetes. Obesity. What are the signs or symptoms? Skin tags usually do not cause symptoms unless they get irritated by items touching your skin, such as clothing or jewelry. When this happens, you may have pain, itching, or bleeding. How is this diagnosed? This condition is diagnosed with an evaluation from your health care provider. No testing is needed for diagnosis. How is this treated? Treatment for this condition depends on whether you have symptoms. Your health care provider may also remove your skin tag if it is visible or if you do not like the way it looks. A skin tag can be removed by a health care provider with: A simple surgical procedure using scissors. A procedure that involves freezing your skin tag with a gas in liquid form (liquid nitrogen). A procedure that uses heat to destroy your skin tag (electrodessication). Follow these instructions at home: Watch for any changes in your skin tag. A normal skin tag does not require any other special care at home. Take over-the-counter and prescription medicines only as told by your health care provider. Keep all follow-up visits. Contact a health care provider if: You have a skin tag that: Becomes painful. Changes  color. Bleeds. Swells. Summary Skin tags are soft, extra growths of skin found in areas of frequent rubbing or friction. Skin tags usually do not cause symptoms. If symptoms occur, you may have pain, itching, or bleeding. Your health care provider may remove your skin tag if it causes symptoms or if you do not like the way it looks. This information is not intended to replace advice given to you by your health care provider. Make sure you discuss any questions you have with your health care provider. Document Revised: 01/09/2022 Document Reviewed: 01/09/2022 Elsevier Patient Education  2024 [employer].

## 2023-10-21 NOTE — Progress Notes (Unsigned)
Subjective:  Patient ID: [name], female    DOB: Aug 25, 1991  Age: 32 y.o. MRN: [id number]  CC: Medical Management of Chronic Issues (Bumps on breast.)   HPI [name] is a 32 y.o. year old female with a history of Prediabetes here for an office visit. She informs me she scheduled this visit as an annual physical exam but review of her chart does not indicate that.  Interval History: Discussed the use of AI scribe software for clinical note transcription with the patient, who gave verbal consent to proceed.  History of Present Illness       She complains of a lesion on the skin of her abdomen inferior to her right breast which has been present for some time, is increasing in size and burning in nature.  She does not have any breast pain, breast lump or other breast symptoms. She is up-to-date on her Pap smear  With regards to her prediabetes her last A1c was 5.9 and she is on diet control.  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  No family history on file.  Social History   Socioeconomic History   Marital status: Married    Spouse name: Not on file   Number of children: Not on file   Years of education: Not on file   Highest education level: Not on file  Occupational History   Not on file  Tobacco Use   Smoking status: Never   Smokeless tobacco: Never  Substance and Sexual Activity   Alcohol use: No   Drug use: No   Sexual activity: Not Currently    Birth control/protection: None  Other Topics Concern   Not on file  Social History Narrative   ** Merged History Encounter **       Social Determinants of Health   Financial Resource Strain: Not on file  Food Insecurity: Not on file  Transportation Needs: Not on file  Physical Activity: Not on file  Stress: Not on file  Social Connections: Not on file    No Known  Allergies  Outpatient Medications Prior to Visit  Medication Sig Dispense Refill   ibuprofen (ADVIL) 800 MG tablet Take 1 tablet (800 mg total) by mouth every 8 (eight) hours as needed (pain). (Patient not taking: Reported on 07/31/2022) 21 tablet 0   omeprazole (PRILOSEC) 40 MG capsule Take 1 capsule (40 mg total) by mouth daily. 30 capsule 3   No facility-administered medications prior to visit.     ROS Review of Systems  Constitutional:  Negative for activity change and appetite change.  HENT:  Negative for sinus pressure and sore throat.   Respiratory:  Negative for chest tightness, shortness of breath and wheezing.   Cardiovascular:  Negative for chest pain and palpitations.  Gastrointestinal:  Negative for abdominal distention, abdominal pain and constipation.  Genitourinary: Negative.   Musculoskeletal: Negative.   Psychiatric/Behavioral:  Negative for behavioral problems and dysphoric mood.     Objective:  BP (!) 142/85   Pulse 85   Ht 5\' 1"  (1.549 m)   Wt 152 lb 9.6 oz (69.2 kg)   SpO2 100%   BMI 28.83 kg/m      10/21/2023    4:29 PM 07/31/2022   11:21 AM 01/11/2022    1:32 PM  BP/Weight  Systolic BP 142 114 112  Diastolic BP 85 74 76  Wt. (Lbs) 152.6 145   BMI 28.83 kg/m2 27.4 kg/m2       Physical Exam Constitutional:      Appearance: She is well-developed.  Cardiovascular:     Rate and Rhythm: Normal rate.     Heart sounds: Normal heart sounds. No murmur heard. Pulmonary:     Effort: Pulmonary effort is normal.     Breath sounds: Normal breath sounds. No wheezing or rales.  Chest:     Chest wall: No tenderness.  Breasts:    Right: No inverted nipple or mass.     Left: No inverted nipple or mass.  Abdominal:     General: Bowel sounds are normal. There is no distension.     Palpations: Abdomen is soft. There is no mass.     Tenderness: There is no abdominal tenderness.  Musculoskeletal:        General: Normal range of motion.     Right lower leg:  No edema.     Left lower leg: No edema.  Skin:    Comments: Skin tag on right side of abdomen which is not tender  Neurological:     Mental Status: She is alert and oriented to person, place, and time.  Psychiatric:        Mood and Affect: Mood normal.        Latest Ref Rng & Units 07/31/2022   12:15 PM 09/25/2021   11:23 AM 05/16/2021    4:52 PM  CMP  Glucose 70 - 99 mg/dL 161   90   BUN 6 - 20 mg/dL 10   17   Creatinine 0.96 - 1.00 mg/dL 0.45   4.09   Sodium 811 - 144 mmol/L 139   136   Potassium 3.5 - 5.2 mmol/L 4.4   4.0   Chloride 96 - 106 mmol/L 102   102   CO2 20 - 29 mmol/L 21   22   Calcium 8.7 - 10.2 mg/dL 9.3   8.8   Total Protein 6.0 - 8.3 g/dL  8.2    Total Bilirubin 0.2 - 1.2 mg/dL  0.3    Alkaline Phos 39 - 117 U/L  47    AST 0 - 37 U/L  14    ALT 0 - 35 U/L  13      Lipid Panel     Component Value Date/Time   CHOL 150 10/14/2016 0912   TRIG 32 10/14/2016 0912   HDL 52 10/14/2016 0912   CHOLHDL 2.9 10/14/2016 0912   VLDL 6 10/14/2016 0912   LDLCALC 92 10/14/2016 0912    CBC    Component Value Date/Time   WBC 7.3 07/31/2022 1215   WBC 8.6 04/07/2017 0923   RBC 4.55 07/31/2022 1215   RBC 4.40 04/07/2017 0923   HGB 13.3 07/31/2022 1215   HGB 12.3 11/22/2015 0000   HCT 39.2 07/31/2022 1215   HCT 37 11/22/2015 0000   PLT 262 07/31/2022 1215   PLT 270 11/22/2015 0000   MCV 86 07/31/2022 1215   MCH 29.2 07/31/2022 1215   MCH 29.5 04/07/2017 0923   MCHC 33.9 07/31/2022 1215   MCHC 32.9 04/07/2017 0923   RDW 13.5 07/31/2022 1215   LYMPHSABS 1.7 07/31/2022 1215   MONOABS 728 01/20/2017 1414   EOSABS 0.1 07/31/2022 1215   BASOSABS 0.0 07/31/2022 1215    Lab Results  Component Value Date   HGBA1C 5.9 (H) 07/31/2022    Assessment &  Plan:  1. Annual visit for general adult medical examination with abnormal findings Counseled on 150 minutes of exercise per week, healthy eating (including decreased daily intake of saturated fats, cholesterol,  added sugars, sodium), routine healthcare maintenance.   2. Skin tag - Ambulatory referral to Dermatology  3. Prediabetes - Hemoglobin A1c; Future - LP+Non-HDL Cholesterol; Future - CMP14+EGFR; Future - CBC with Differential/Platelet; Future                No orders of the defined types were placed in this encounter.   Follow-up: No follow-ups on file.       Hoy Register, MD, FAAFP. [facility] [place], [place] [phone]   10/21/2023, 4:29 PM

## 2023-10-22 ENCOUNTER — Encounter: Payer: Self-pay | Admitting: Family Medicine

## 2023-10-28 ENCOUNTER — Ambulatory Visit: Payer: Medicaid Other | Attending: Family Medicine

## 2023-10-28 DIAGNOSIS — R7303 Prediabetes: Secondary | ICD-10-CM | POA: Diagnosis not present

## 2023-10-29 LAB — CBC WITH DIFFERENTIAL/PLATELET
Basophils Absolute: 0.1 10*3/uL (ref 0.0–0.2)
Basos: 1 %
EOS (ABSOLUTE): 0.2 10*3/uL (ref 0.0–0.4)
Eos: 2 %
Hematocrit: 37.5 % (ref 34.0–46.6)
Hemoglobin: 12.3 g/dL (ref 11.1–15.9)
Immature Grans (Abs): 0.1 10*3/uL (ref 0.0–0.1)
Immature Granulocytes: 1 %
Lymphocytes Absolute: 2.6 10*3/uL (ref 0.7–3.1)
Lymphs: 25 %
MCH: 28.7 pg (ref 26.6–33.0)
MCHC: 32.8 g/dL (ref 31.5–35.7)
MCV: 87 fL (ref 79–97)
Monocytes Absolute: 0.7 10*3/uL (ref 0.1–0.9)
Monocytes: 7 %
Neutrophils Absolute: 6.6 10*3/uL (ref 1.4–7.0)
Neutrophils: 64 %
Platelets: 313 10*3/uL (ref 150–450)
RBC: 4.29 x10E6/uL (ref 3.77–5.28)
RDW: 13.1 % (ref 11.7–15.4)
WBC: 10.1 10*3/uL (ref 3.4–10.8)

## 2023-10-29 LAB — LP+NON-HDL CHOLESTEROL
Cholesterol, Total: 193 mg/dL (ref 100–199)
HDL: 46 mg/dL (ref >39)
LDL Chol Calc (NIH): 128 mg/dL — ABNORMAL HIGH (ref 0–99)
Total Non-HDL-Chol (LDL+VLDL): 147 mg/dL — ABNORMAL HIGH (ref 0–129)
Triglycerides: 105 mg/dL (ref 0–149)
VLDL Cholesterol Cal: 19 mg/dL (ref 5–40)

## 2023-10-29 LAB — CMP14+EGFR
ALT: 17 [IU]/L (ref 0–32)
AST: 19 [IU]/L (ref 0–40)
Albumin: 4.3 g/dL (ref 4.0–5.0)
Alkaline Phosphatase: 61 [IU]/L (ref 44–121)
BUN/Creatinine Ratio: 16 (ref 9–23)
BUN: 10 mg/dL (ref 6–20)
Bilirubin Total: 0.3 mg/dL (ref 0.0–1.2)
CO2: 23 mmol/L (ref 20–29)
Calcium: 9 mg/dL (ref 8.7–10.2)
Chloride: 101 mmol/L (ref 96–106)
Creatinine, Ser: 0.61 mg/dL (ref 0.57–1.00)
Globulin, Total: 3.2 g/dL (ref 1.5–4.5)
Glucose: 80 mg/dL (ref 70–99)
Potassium: 3.7 mmol/L (ref 3.5–5.2)
Sodium: 137 mmol/L (ref 134–144)
Total Protein: 7.5 g/dL (ref 6.0–8.5)
eGFR: 123 mL/min/{1.73_m2} (ref >59)

## 2023-10-29 LAB — HEMOGLOBIN A1C
Est. average glucose Bld gHb Est-mCnc: 131 mg/dL
Hgb A1c MFr Bld: 6.2 % — ABNORMAL HIGH (ref 4.8–5.6)

## 2024-02-24 ENCOUNTER — Encounter (HOSPITAL_COMMUNITY): Payer: Self-pay

## 2024-02-24 ENCOUNTER — Ambulatory Visit (HOSPITAL_COMMUNITY)
Admission: EM | Admit: 2024-02-24 | Discharge: 2024-02-24 | Disposition: A | Discharge status: Home / Self Care | Attending: Internal Medicine | Admitting: Internal Medicine

## 2024-02-24 ENCOUNTER — Ambulatory Visit (INDEPENDENT_AMBULATORY_CARE_PROVIDER_SITE_OTHER)

## 2024-02-24 DIAGNOSIS — R1011 Right upper quadrant pain: Secondary | ICD-10-CM

## 2024-02-24 DIAGNOSIS — K5909 Other constipation: Secondary | ICD-10-CM

## 2024-02-24 DIAGNOSIS — R102 Pelvic and perineal pain: Secondary | ICD-10-CM

## 2024-02-24 DIAGNOSIS — R1024 Suprapubic pain: Secondary | ICD-10-CM

## 2024-02-24 DIAGNOSIS — M792 Neuralgia and neuritis, unspecified: Secondary | ICD-10-CM

## 2024-02-24 DIAGNOSIS — R1031 Right lower quadrant pain: Secondary | ICD-10-CM | POA: Diagnosis not present

## 2024-02-24 LAB — POCT URINALYSIS DIP (MANUAL ENTRY)
Bilirubin, UA: NEGATIVE
Glucose, UA: NEGATIVE mg/dL
Ketones, POC UA: NEGATIVE mg/dL
Leukocytes, UA: NEGATIVE
Nitrite, UA: NEGATIVE
Protein Ur, POC: NEGATIVE mg/dL
Spec Grav, UA: 1.02
Urobilinogen, UA: 0.2 U/dL
pH, UA: 5.5

## 2024-02-24 MED ORDER — PREDNISONE 20 MG PO TABS: 20.0000 mg | ORAL_TABLET | Freq: Every day | ORAL | 0 refills | Status: DC

## 2024-02-24 MED ORDER — DOCUSATE SODIUM 250 MG PO CAPS: 250.0000 mg | ORAL_CAPSULE | Freq: Every evening | ORAL | 0 refills | Status: DC

## 2024-02-24 NOTE — ED Provider Notes (Addendum)
 [facility]    CSN: [id number] Arrival date & time: 02/24/24  3295      History   Chief Complaint No chief complaint on file.   HPI [name] is a 33 y.o. female who presents with L side scalp tenderness and has burning sensation x 5 days. Other times feels stabbing pain on this area off and on and into her L ear. Sometimes feels like some type of fluid is running out of her when she has the pain, but when she checks there is nothing wet. Denies dental problems.  Denies fever.   2- States her abdomen has been growling x 6 days, denies pain. Denies bloating, constipation or diarrhea. The growling is not associated with eating or not eating. She is currently on her period.     Past Medical History:  Diagnosis Date   Gestational diabetes    Hepatitis B infection    +test in first pregancy    Patient Active Problem List   Diagnosis Date Noted   History of gestational diabetes 01/20/2017   Hepatitis B, chronic (HCC) 03/13/2016   Language barrier, cultural differences 03/13/2016    History reviewed. No pertinent surgical history.  OB History     Gravida  2   Para  2   Term  2   Preterm  0   AB  0   Living  2      SAB  0   IAB  0   Ectopic  0   Multiple      Live Births  2            Home Medications    Prior to Admission medications   Medication Sig Start Date End Date Taking? Authorizing Provider  docusate sodium (COLACE) 250 MG capsule Take 1 capsule (250 mg total) by mouth at bedtime. 02/24/24  Yes Rodriguez-Southworth, Nettie Elm, PA-C  predniSONE (DELTASONE) 20 MG tablet Take 1 tablet (20 mg total) by mouth daily with breakfast. 02/24/24  Yes Rodriguez-Southworth, Nettie Elm, PA-C    Family History History reviewed. No pertinent family history.  Social History Social History   Tobacco Use   Smoking status: Never   Smokeless tobacco: Never  Vaping Use   Vaping status: Never Used  Substance Use Topics   Alcohol use:  No   Drug use: No     Allergies   Patient has no known allergies.   Review of Systems Review of Systems  As noted in HPI Physical Exam Triage Vital Signs ED Triage Vitals [02/24/24 0832]  Encounter Vitals Group     BP 120/74     Systolic BP Percentile      Diastolic BP Percentile      Pulse Rate 64     Resp 14     Temp 98 F (36.7 C)     Temp Source Oral     SpO2 99 %     Weight      Height      Head Circumference      Peak Flow      Pain Score      Pain Loc      Pain Education      Exclude from Growth Chart    No data found.  Updated Vital Signs BP 120/74 (BP Location: Left Arm)   Pulse 64   Temp 98 F (36.7 C) (Oral)   Resp 14   LMP 02/21/2024   SpO2 99%   Visual Acuity Right Eye  Distance:   Left Eye Distance:   Bilateral Distance:    Right Eye Near:   Left Eye Near:    Bilateral Near:     Physical Exam Constitutional:      General: She is not in acute distress.    Appearance: She is not toxic-appearing.  HENT:     Head: Atraumatic.     Comments: Her L top scalp looks intact, but with light touch has burning sensation, and with deep palpation is more tender.     Right Ear: Tympanic membrane, ear canal and external ear normal.     Left Ear: Tympanic membrane, ear canal and external ear normal.     Nose: Nose normal.  Eyes:     General: No scleral icterus.    Conjunctiva/sclera: Conjunctivae normal.  Pulmonary:     Effort: Pulmonary effort is normal.  Abdominal:     Palpations: Abdomen is soft. There is no mass.     Tenderness: There is no guarding or rebound.     Comments: Is tender on RLQ and suprapubic region   Musculoskeletal:        General: Normal range of motion.     Cervical back: Neck supple.  Lymphadenopathy:     Cervical: No cervical adenopathy.  Skin:    General: Skin is warm and dry.     Findings: No bruising, lesion or rash.  Neurological:     Mental Status: She is alert and oriented to person, place, and time.      Gait: Gait normal.  Psychiatric:        Mood and Affect: Mood normal.        Behavior: Behavior normal.      UC Treatments / Results  Labs (all labs ordered are listed, but only abnormal results are displayed) Labs Reviewed  POCT URINALYSIS DIP (MANUAL ENTRY) - Abnormal; Notable for the following components:      Result Value   Blood, UA small (*)    All other components within normal limits  Pt is on her period  EKG   Radiology DG Abd 2 Views Result Date: 02/24/2024 CLINICAL DATA:  Right lower quadrant pain.  Suprapubic pain EXAM: ABDOMEN - 2 VIEW COMPARISON:  Abdominal ultrasound 04/21/2017. FINDINGS: Moderate diffuse colonic stool. Gas and stool seen in the rectum. Gas also seen in nondilated loops of small bowel. Air-fluid level along the stomach. Overlapping metallic artifacts overlying the mid abdomen on both sides. IMPRESSION: Large amount of diffuse colonic stool. Gas and stool towards the rectum. No obstruction. No obvious free air. Electronically Signed   By: Karen Kays M.D.   On: 02/24/2024 10:41   Preliminary read by me shows increased stool, R>L Procedures Procedures (including critical care time)  Medications Ordered in UC Medications - No data to display  Initial Impression / Assessment and Plan / UC Course  I have reviewed the triage vital signs and the nursing notes.  Pertinent labs & imaging results that were available during my care of the patient were reviewed by me and considered in my medical decision making (see chart for details).  Scalp neuritis Constipation  I placed her on Colase and Prednisone as noted. She is to watch out for a rash, if so needs to come right away.    Final Clinical Impressions(s) / UC Diagnoses   Final diagnoses:  Abdominal pain, right upper quadrant  Suprapubic pain, acute  Other constipation  Neuralgia and neuritis     Discharge Instructions  Your urine test does not show any infection Your stomach xray  shows you have a lot of stool in your color, specially the left side, so I sent a stool softer to help you empty out called Colase If you develop a rash on your scalp, please come back I will have you try Prednisone to help the scalp burning pain which is normally how a nerve pain is sensed. Sometimes this can occur before shingles starts.  Please call your family doctor to follow up next week.      ED Prescriptions     Medication Sig Dispense Auth. Provider   docusate sodium (COLACE) 250 MG capsule Take 1 capsule (250 mg total) by mouth at bedtime. 10 capsule Rodriguez-Southworth, Nettie Elm, PA-C   predniSONE (DELTASONE) 20 MG tablet Take 1 tablet (20 mg total) by mouth daily with breakfast. 5 tablet Rodriguez-Southworth, Nettie Elm, PA-C      PDMP not reviewed this encounter.   Rodriguez-Southworth, Nettie Elm, PA-C 02/24/24 1000    Rodriguez-Southworth, Nettie Elm, [place] 02/24/24 1314

## 2024-02-24 NOTE — Discharge Instructions (Signed)
 Your urine test does not show any infection Your stomach xray shows you have a lot of stool in your color, specially the left side, so I sent a stool softer to help you empty out called Colase If you develop a rash on your scalp, please come back I will have you try Prednisone to help the scalp burning pain which is normally how a nerve pain is sensed. Sometimes this can occur before shingles starts.  Please call your family doctor to follow up next week.

## 2024-02-24 NOTE — ED Triage Notes (Signed)
 Per Interpreter-Audio interpreter  Patient c/o left-sided headache, left ear pain, and "my stomach is growling a lot." X 6 days.  Patient reports no medications for her symptoms.

## 2024-02-25 ENCOUNTER — Other Ambulatory Visit (HOSPITAL_COMMUNITY): Payer: Self-pay

## 2024-02-25 DIAGNOSIS — K5900 Constipation, unspecified: Secondary | ICD-10-CM

## 2024-02-25 MED ORDER — POLYETHYLENE GLYCOL 3350 17 GM/SCOOP PO POWD: 17.0000 g | Freq: Every day | ORAL | 0 refills | Status: DC | PRN

## 2024-02-25 NOTE — Telephone Encounter (Signed)
 Miralax ordered to help with constipation. RN spoke with patient and patient requested Rx for Miralax.

## 2024-05-12 ENCOUNTER — Ambulatory Visit: Payer: Self-pay

## 2024-05-12 NOTE — Telephone Encounter (Signed)
 Patient would like to change PCP's

## 2024-05-12 NOTE — Telephone Encounter (Signed)
 FYI Only or Action Required?: FYI only for provider  Patient was last seen in primary care on 10/21/2023 by Breanna Mulberry, MD. Called Nurse Triage reporting Abdominal Pain. Symptoms began several days ago. Interventions attempted: Medication, rest. Symptoms are: gradually worsening.  Triage Disposition: See HCP Within 4 Hours (Or PCP Triage)  Patient/caregiver understands and will follow disposition?: Advised urgent care since no appointments in office, patient states she will go to urgent care today or tomorrow. Patient also requesting to schedule physical with another provider, would like to transfer pcp. Please follow up.    Copied from CRM [id number]. Topic: Clinical - Red Word Triage >> May 12, 2024 11:44 AM [place] B wrote: Pt has stabbing pain on left side of abdomen Reason for Disposition  [1] MILD-MODERATE pain AND [2] constant AND [3] present > 2 hours  Answer Assessment - Initial Assessment Questions 1. LOCATION: "Where does it hurt?"      Abdomen left side lower 2. RADIATION: "Does the pain shoot anywhere else?" (e.g., chest, back)     no 3. ONSET: "When did the pain begin?" (e.g., minutes, hours or days ago)      2 days ago. Recurring. Urgent care is the past, constipated. Swelling noted in abdomen and rx new medication that she finished. Calling for follow up appointment.  4. SUDDEN: "Gradual or sudden onset?"     NA 5. PATTERN "Does the pain come and go, or is it constant?"    - If it comes and goes: "How long does it last?" "Do you have pain now?"     (Note: Comes and goes means the pain is intermittent. It goes away completely between bouts.)    - If constant: "Is it getting better, staying the same, or getting worse?"      (Note: Constant means the pain never goes away completely; most serious pain is constant and gets worse.)      intermittent 6. SEVERITY: "How bad is the pain?"  (e.g., Scale 1-10; mild, moderate, or severe)    - MILD (1-3): Doesn't interfere with  normal activities, abdomen soft and not tender to touch.     - MODERATE (4-7): Interferes with normal activities or awakens from sleep, abdomen tender to touch.     - SEVERE (8-10): Excruciating pain, doubled over, unable to do any normal activities.        7. RECURRENT SYMPTOM: "Have you ever had this type of stomach pain before?" If Yes, ask: "When was the last time?" and "What happened that time?"      Yes 8. CAUSE: "What do you think is causing the stomach pain?"     Possible constipation and inflammation 9. RELIEVING/AGGRAVATING FACTORS: "What makes it better or worse?" (e.g., antacids, bending or twisting motion, bowel movement)     None 10. OTHER SYMPTOMS: "Do you have any other symptoms?" (e.g., back pain, diarrhea, fever, urination pain, vomiting)       Denies 11. PREGNANCY: "Is there any chance you are pregnant?" "When was your last menstrual period?"       No  Additional info:  Pain worse than when at urgent care. Last BM 63/25  Protocols used: Abdominal Pain - [facility]

## 2024-05-22 ENCOUNTER — Ambulatory Visit (INDEPENDENT_AMBULATORY_CARE_PROVIDER_SITE_OTHER)

## 2024-05-22 ENCOUNTER — Ambulatory Visit (HOSPITAL_COMMUNITY)
Admission: EM | Admit: 2024-05-22 | Discharge: 2024-05-22 | Disposition: A | Discharge status: Home / Self Care | Attending: Internal Medicine | Admitting: Internal Medicine

## 2024-05-22 ENCOUNTER — Encounter (HOSPITAL_COMMUNITY): Payer: Self-pay

## 2024-05-22 DIAGNOSIS — R109 Unspecified abdominal pain: Secondary | ICD-10-CM | POA: Diagnosis not present

## 2024-05-22 DIAGNOSIS — K5909 Other constipation: Secondary | ICD-10-CM

## 2024-05-22 DIAGNOSIS — R1032 Left lower quadrant pain: Secondary | ICD-10-CM

## 2024-05-22 DIAGNOSIS — K5900 Constipation, unspecified: Secondary | ICD-10-CM | POA: Diagnosis not present

## 2024-05-22 MED ORDER — LACTULOSE 10 GM/15ML PO SOLN: 10.0000 g | Freq: Two times a day (BID) | ORAL | 0 refills | Status: DC | PRN

## 2024-05-22 NOTE — ED Triage Notes (Signed)
 [facility]  Patient c/o intermittent left abdominal pain x 1 week. Patient denies any N/v/D. Patient states pain is worse when she walks.  Patient denies any medications for her symptoms.

## 2024-05-22 NOTE — ED Provider Notes (Signed)
 [facility]    CSN: [id number] Arrival date & time: 05/22/24  1003      History   Chief Complaint Chief Complaint  Patient presents with   Abdominal Pain    HPI [name] is a 33 y.o. female.   33 year old female who presents urgent care with complaints of left lower quadrant abdominal pain.  This has been going on for about 1 week.  She denies any nausea, vomiting, dysuria, fever, hematuria, vaginal discharge.  The pain is dull at times and can be sharp.  Palpitation does make it worse.  She reports that she has a bowel movement about every 2 days but it is not much.  She does have trouble passing gas at times.  She has been using a stool softener.  She is eating and drinking normal. She has a h/o constipation.      Abdominal Pain Associated symptoms: no chest pain, no chills, no cough, no dysuria, no fever, no hematuria, no shortness of breath, no sore throat and no vomiting     Past Medical History:  Diagnosis Date   Gestational diabetes    Hepatitis B infection    +test in first pregancy    Patient Active Problem List   Diagnosis Date Noted   History of gestational diabetes 01/20/2017   Hepatitis B, chronic (HCC) 03/13/2016   Language barrier, cultural differences 03/13/2016    History reviewed. No pertinent surgical history.  OB History     Gravida  2   Para  2   Term  2   Preterm  0   AB  0   Living  2      SAB  0   IAB  0   Ectopic  0   Multiple      Live Births  2            Home Medications    Prior to Admission medications   Medication Sig Start Date End Date Taking? Authorizing Provider  lactulose (CHRONULAC) 10 GM/15ML solution Take 15 mLs (10 g total) by mouth 2 (two) times daily as needed for moderate constipation or severe constipation. 05/22/24  Yes Erinn Huskins A, PA-C  docusate sodium  (COLACE) 250 MG capsule Take 1 capsule (250 mg total) by mouth at bedtime. 02/24/24   Rodriguez-Southworth,  Sylvia, PA-C  polyethylene glycol powder (MIRALAX ) 17 GM/SCOOP powder Take 17 g by mouth daily as needed for moderate constipation or mild constipation. 02/25/24   Starlene Eaton, FNP  predniSONE  (DELTASONE ) 20 MG tablet Take 1 tablet (20 mg total) by mouth daily with breakfast. 02/24/24   Rodriguez-Southworth, Lamond Pilot, PA-C    Family History History reviewed. No pertinent family history.  Social History Social History   Tobacco Use   Smoking status: Never   Smokeless tobacco: Never  Vaping Use   Vaping status: Never Used  Substance Use Topics   Alcohol use: No   Drug use: No     Allergies   Patient has no known allergies.   Review of Systems Review of Systems  Constitutional:  Negative for chills and fever.  HENT:  Negative for ear pain and sore throat.   Eyes:  Negative for pain and visual disturbance.  Respiratory:  Negative for cough and shortness of breath.   Cardiovascular:  Negative for chest pain and palpitations.  Gastrointestinal:  Positive for abdominal pain (left side). Negative for vomiting.  Genitourinary:  Negative for dysuria and hematuria.  Musculoskeletal:  Negative for  arthralgias and back pain.  Skin:  Negative for color change and rash.  Neurological:  Negative for seizures and syncope.  All other systems reviewed and are negative.    Physical Exam Triage Vital Signs ED Triage Vitals  Encounter Vitals Group     BP 05/22/24 1015 126/78     Girls Systolic BP Percentile --      Girls Diastolic BP Percentile --      Boys Systolic BP Percentile --      Boys Diastolic BP Percentile --      Pulse Rate 05/22/24 1015 83     Resp 05/22/24 1015 14     Temp 05/22/24 1015 98.5 F (36.9 C)     Temp Source 05/22/24 1015 Oral     SpO2 05/22/24 1015 97 %     Weight --      Height --      Head Circumference --      Peak Flow --      Pain Score 05/22/24 1020 0     Pain Loc --      Pain Education --      Exclude from Growth Chart --    No data  found.  Updated Vital Signs BP 126/78 (BP Location: Left Arm)   Pulse 83   Temp 98.5 F (36.9 C) (Oral)   Resp 14   LMP 05/08/2024 (Approximate)   SpO2 97%   Visual Acuity Right Eye Distance:   Left Eye Distance:   Bilateral Distance:    Right Eye Near:   Left Eye Near:    Bilateral Near:     Physical Exam Vitals and nursing note reviewed.  Constitutional:      General: She is not in acute distress.    Appearance: She is well-developed.  HENT:     Head: Normocephalic and atraumatic.   Eyes:     Conjunctiva/sclera: Conjunctivae normal.    Cardiovascular:     Rate and Rhythm: Normal rate and regular rhythm.     Heart sounds: No murmur heard. Pulmonary:     Effort: Pulmonary effort is normal. No respiratory distress.     Breath sounds: Normal breath sounds.  Abdominal:     Palpations: Abdomen is soft.     Tenderness: There is no abdominal tenderness.   Musculoskeletal:        General: No swelling.     Cervical back: Neck supple.   Skin:    General: Skin is warm and dry.     Capillary Refill: Capillary refill takes less than 2 seconds.   Neurological:     Mental Status: She is alert.   Psychiatric:        Mood and Affect: Mood normal.      UC Treatments / Results  Labs (all labs ordered are listed, but only abnormal results are displayed) Labs Reviewed - No data to display  EKG   Radiology DG Abd 2 Views Result Date: 05/22/2024 CLINICAL DATA:  33 year old female with intermittent left abdominal pain for 1 week. History of constipation. EXAM: ABDOMEN - 2 VIEW COMPARISON:  Abdominal radiographs 02/24/2024. FINDINGS: Upright and supine views 1101 hours. No pneumoperitoneum. Grossly stable and negative lung bases. Non obstructed bowel gas pattern. Similar moderate volume of retained stool throughout the large bowel, relatively mild volume in the rectosigmoid colon. Other abdominal and pelvic visceral contours appear stable and normal. No osseous  abnormality identified. IMPRESSION: 1.  Normal bowel gas pattern, no free air. 2.  Similar  moderate colonic stool burden seen in March. Electronically Signed   By: Marlise Simpers M.D.   On: 05/22/2024 11:43    Procedures Procedures (including critical care time)  Medications Ordered in UC Medications - No data to display  Initial Impression / Assessment and Plan / UC Course  I have reviewed the triage vital signs and the nursing notes.  Pertinent labs & imaging results that were available during my care of the patient were reviewed by me and considered in my medical decision making (see chart for details).     Abdominal pain, left lower quadrant - Plan: DG Abd 2 Views, DG Abd 2 Views  Other constipation   X-ray of the abdomen done today.  Final evaluation by the radiologist is still pending but on brief evaluation there does appear to be a large amount of stool in the colon.  This is consistent with constipation. Reassuringly, there is no nausea, vomiting, fevers, or diarrhea. Given that there are no other concerning symptoms we will treat with the following: Lactulose 15 mLs twice daily as needed for constipation.  Drink plenty of water to stay hydrated.  Make an appointment with your primary care provider for follow up of this symptom in the next 1-2 weeks. You may to be referred to a gastroenterologist for further evaluation. If abdominal pain worsens or you develop nausea, vomiting, fevers then recommend going to the emergency department for further evaluation  Final Clinical Impressions(s) / UC Diagnoses   Final diagnoses:  Abdominal pain, left lower quadrant  Other constipation     Discharge Instructions      X-ray of the abdomen done today.  Final evaluation by the radiologist is still pending but on brief evaluation there does appear to be a large amount of stool in the colon.  This is consistent with constipation. Reassuringly, there is no nausea, vomiting, fevers, or diarrhea.  Given that there are no other concerning symptoms we will treat with the following: Lactulose 15 mLs twice daily as needed for constipation.  Drink plenty of water to stay hydrated.  Make an appointment with your primary care provider for follow up of this symptom in the next 1-2 weeks. You may to be referred to a gastroenterologist for further evaluation. If abdominal pain worsens or you develop nausea, vomiting, fevers then recommend going to the emergency department for further evaluation     ED Prescriptions     Medication Sig Dispense Auth. Provider   lactulose (CHRONULAC) 10 GM/15ML solution Take 15 mLs (10 g total) by mouth 2 (two) times daily as needed for moderate constipation or severe constipation. 236 mL Kreg Pesa, [place]      PDMP not reviewed this encounter.   Kreg Pesa, [place] 05/22/24 1146

## 2024-05-22 NOTE — Discharge Instructions (Addendum)
 X-ray of the abdomen done today.  Final evaluation by the radiologist is still pending but on brief evaluation there does appear to be a large amount of stool in the colon.  This is consistent with constipation. Reassuringly, there is no nausea, vomiting, fevers, or diarrhea. Given that there are no other concerning symptoms we will treat with the following: Lactulose 15 mLs twice daily as needed for constipation.  Drink plenty of water to stay hydrated.  Make an appointment with your primary care provider for follow up of this symptom in the next 1-2 weeks. You may to be referred to a gastroenterologist for further evaluation. If abdominal pain worsens or you develop nausea, vomiting, fevers then recommend going to the emergency department for further evaluation

## 2024-07-01 ENCOUNTER — Ambulatory Visit (INDEPENDENT_AMBULATORY_CARE_PROVIDER_SITE_OTHER): Admitting: Primary Care

## 2024-07-01 ENCOUNTER — Encounter (INDEPENDENT_AMBULATORY_CARE_PROVIDER_SITE_OTHER): Payer: Self-pay | Admitting: Primary Care

## 2024-07-01 VITALS — BP 126/84 | HR 78 | Resp 16 | Wt 149.6 lb

## 2024-07-01 DIAGNOSIS — Z603 Acculturation difficulty: Secondary | ICD-10-CM

## 2024-07-01 DIAGNOSIS — R1084 Generalized abdominal pain: Secondary | ICD-10-CM

## 2024-07-01 DIAGNOSIS — Z09 Encounter for follow-up examination after completed treatment for conditions other than malignant neoplasm: Secondary | ICD-10-CM

## 2024-07-01 NOTE — Progress Notes (Signed)
 Subjective:   [name] is a 33 y.o. 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 She has a bowel movement every 2 days but it is not much is passed . Problems passing gas at times. She does have a hx of constipation. Abdominal x-ray - retained stool throughout the large bowel, relatively mild volume in the rectosigmoid colon. Other abdominal and pelvic visceral contours appear stable and normal. No osseous abnormality identified. (PCP Dr. Delbert) Past Medical History:  Diagnosis Date   Gestational diabetes    Hepatitis B infection    +test in first pregancy     No Known Allergies  Current Outpatient Medications on File Prior to Visit  Medication Sig Dispense Refill   docusate sodium  (COLACE) 250 MG capsule Take 1 capsule (250 mg total) by mouth at bedtime. (Patient not taking: Reported on 07/01/2024) 10 capsule 0   lactulose  (CHRONULAC ) 10 GM/15ML solution Take 15 mLs (10 g total) by mouth 2 (two) times daily as needed for moderate constipation or severe constipation. (Patient not taking: Reported on 07/01/2024) 236 mL 0   polyethylene glycol powder (MIRALAX ) 17 GM/SCOOP powder Take 17 g by mouth daily as needed for moderate constipation or mild constipation. (Patient not taking: Reported on 07/01/2024) 255 g 0   predniSONE  (DELTASONE ) 20 MG tablet Take 1 tablet (20 mg total) by mouth daily with breakfast. (Patient not taking: Reported on 07/01/2024) 5 tablet 0   No current facility-administered medications on file prior to visit.    Review of System: ROS Comprehensive ROS Pertinent positive and negative noted in HPI   Objective:  BP 126/84   Pulse 78   Resp 16   Wt 149 lb 9.6 oz (67.9 kg)   SpO2 100%   BMI 28.27 kg/m   Filed Weights   07/01/24 1542  Weight: 149 lb 9.6 oz (67.9 kg)    Physical Exam Vitals reviewed.  Constitutional:      Comments: Over weight   HENT:     Head:  Normocephalic.     Right Ear: External ear normal.     Left Ear: External ear normal.     Nose: Nose normal.  Cardiovascular:     Rate and Rhythm: Normal rate and regular rhythm.  Pulmonary:     Effort: Pulmonary effort is normal.     Breath sounds: Normal breath sounds.  Abdominal:     General: Bowel sounds are normal. There is distension.     Palpations: Abdomen is soft.     Tenderness: There is abdominal tenderness.     Comments: Pain > under umbilical no hernia palpated   Musculoskeletal:     Cervical back: Normal range of motion.  Skin:    General: Skin is warm and dry.  Neurological:     Mental Status: She is alert and oriented to person, place, and time.  Psychiatric:        Mood and Affect: Mood normal.        Behavior: Behavior normal.      Assessment:  [name] was seen today for hospitalization follow-up.  Diagnoses and all orders for this visit:  Generalized abdominal pain -     Ambulatory referral to Gastroenterology  Language barrier, cultural differences  Hospital discharge follow-up See HPI      This note has been created with Dragon speech recognition software and Paediatric nurse. Any transcriptional errors are unintentional.   PCP  Breanna SHAUNNA Bohr, NP 07/01/2024, 4:07 PM

## 2024-07-06 ENCOUNTER — Encounter: Admitting: Family Medicine

## 2024-09-14 NOTE — Progress Notes (Unsigned)
 Breanna Console, PA-C [address] [place], [place]  [address] Phone: [phone]   Primary Care Physician: Delbert Clam, MD  Primary Gastroenterologist:  Breanna Console, PA-C / Glendia Holt, MD   Chief Complaint: Abdominal pain; history of H. pylori       HPI:   [name] is a 33 y.o. female  Current Outpatient Medications  Medication Sig Dispense Refill   docusate sodium  (COLACE) 250 MG capsule Take 1 capsule (250 mg total) by mouth at bedtime. (Patient not taking: Reported on 07/01/2024) 10 capsule 0   lactulose  (CHRONULAC ) 10 GM/15ML solution Take 15 mLs (10 g total) by mouth 2 (two) times daily as needed for moderate constipation or severe constipation. (Patient not taking: Reported on 07/01/2024) 236 mL 0   polyethylene glycol powder (MIRALAX ) 17 GM/SCOOP powder Take 17 g by mouth daily as needed for moderate constipation or mild constipation. (Patient not taking: Reported on 07/01/2024) 255 g 0   predniSONE  (DELTASONE ) 20 MG tablet Take 1 tablet (20 mg total) by mouth daily with breakfast. (Patient not taking: Reported on 07/01/2024) 5 tablet 0   No current facility-administered medications for this visit.    Allergies as of 09/15/2024   (No Known Allergies)    Past Medical History:  Diagnosis Date   Gestational diabetes    Hepatitis B infection    +test in first pregancy    No past surgical history on file.  Review of Systems:    All systems reviewed and negative except where noted in HPI.    Physical Exam:  There were no vitals taken for this visit. No LMP recorded.  General: Well-nourished, well-developed in no acute distress.  Lungs: Clear to auscultation bilaterally. Non-labored. Heart: Regular rate and rhythm, no murmurs rubs or gallops.  Abdomen: Bowel sounds are normal; Abdomen is Soft; No hepatosplenomegaly, masses or hernias;  No Abdominal Tenderness; No guarding or rebound tenderness. Neuro: Alert and oriented x 3.  Grossly  intact.  Psych: Alert and cooperative, normal mood and affect.   Imaging Studies: No results found.  Labs: CBC    Component Value Date/Time   WBC 10.1 10/28/2023 1542   WBC 8.6 04/07/2017 0923   RBC 4.29 10/28/2023 1542   RBC 4.40 04/07/2017 0923   HGB 12.3 10/28/2023 1542   HGB 12.3 11/22/2015 0000   HCT 37.5 10/28/2023 1542   HCT 37 11/22/2015 0000   PLT 313 10/28/2023 1542   PLT 270 11/22/2015 0000   MCV 87 10/28/2023 1542   MCH 28.7 10/28/2023 1542   MCH 29.5 04/07/2017 0923   MCHC 32.8 10/28/2023 1542   MCHC 32.9 04/07/2017 0923   RDW 13.1 10/28/2023 1542   LYMPHSABS 2.6 10/28/2023 1542   MONOABS 728 01/20/2017 1414   EOSABS 0.2 10/28/2023 1542   BASOSABS 0.1 10/28/2023 1542    CMP     Component Value Date/Time   NA 137 10/28/2023 1542   K 3.7 10/28/2023 1542   CL 101 10/28/2023 1542   CO2 23 10/28/2023 1542   GLUCOSE 80 10/28/2023 1542   GLUCOSE 100 (H) 11/16/2018 1012   GLUCOSE 94 03/28/2016 1143   BUN 10 10/28/2023 1542   CREATININE 0.61 10/28/2023 1542   CREATININE 0.66 11/16/2018 1012   CALCIUM 9.0 10/28/2023 1542   PROT 7.5 10/28/2023 1542   ALBUMIN 4.3 10/28/2023 1542   AST 19 10/28/2023 1542   ALT 17 10/28/2023 1542   ALKPHOS 61 10/28/2023 1542   BILITOT 0.3  10/28/2023 1542   GFRNONAA 126 11/24/2019 1140   GFRNONAA 123 11/16/2018 1012   GFRAA 145 11/24/2019 1140   GFRAA 142 11/16/2018 1012       Assessment and Plan:   [name] is a 33 y.o. y/o female ***    Breanna Console, PA-C  Follow up ***

## 2024-09-15 ENCOUNTER — Encounter: Payer: Self-pay | Admitting: Physician Assistant

## 2024-09-15 ENCOUNTER — Other Ambulatory Visit (INDEPENDENT_AMBULATORY_CARE_PROVIDER_SITE_OTHER)

## 2024-09-15 ENCOUNTER — Ambulatory Visit: Payer: Self-pay | Admitting: Physician Assistant

## 2024-09-15 ENCOUNTER — Ambulatory Visit: Admitting: Physician Assistant

## 2024-09-15 VITALS — BP 106/72 | HR 83 | Ht 61.0 in | Wt 150.1 lb

## 2024-09-15 DIAGNOSIS — B181 Chronic viral hepatitis B without delta-agent: Secondary | ICD-10-CM

## 2024-09-15 DIAGNOSIS — R1032 Left lower quadrant pain: Secondary | ICD-10-CM | POA: Diagnosis not present

## 2024-09-15 DIAGNOSIS — K5909 Other constipation: Secondary | ICD-10-CM

## 2024-09-15 DIAGNOSIS — R1031 Right lower quadrant pain: Secondary | ICD-10-CM

## 2024-09-15 DIAGNOSIS — R1030 Lower abdominal pain, unspecified: Secondary | ICD-10-CM

## 2024-09-15 DIAGNOSIS — K5904 Chronic idiopathic constipation: Secondary | ICD-10-CM

## 2024-09-15 LAB — COMPREHENSIVE METABOLIC PANEL WITH GFR
ALT: 13 U/L (ref 0–35)
AST: 14 U/L (ref 0–37)
Albumin: 4.6 g/dL (ref 3.5–5.2)
Alkaline Phosphatase: 46 U/L (ref 39–117)
BUN: 13 mg/dL (ref 6–23)
CO2: 27 meq/L (ref 19–32)
Calcium: 9.2 mg/dL (ref 8.4–10.5)
Chloride: 101 meq/L (ref 96–112)
Creatinine, Ser: 0.65 mg/dL (ref 0.40–1.20)
GFR: 117.19 mL/min (ref >60.00)
Glucose, Bld: 101 mg/dL — ABNORMAL HIGH (ref 70–99)
Potassium: 4.1 meq/L (ref 3.5–5.1)
Sodium: 136 meq/L (ref 135–145)
Total Bilirubin: 0.5 mg/dL (ref 0.2–1.2)
Total Protein: 8.2 g/dL (ref 6.0–8.3)

## 2024-09-15 LAB — CBC WITH DIFFERENTIAL/PLATELET
Basophils Absolute: 0 K/uL (ref 0.0–0.1)
Basophils Relative: 0.5 % (ref 0.0–3.0)
Eosinophils Absolute: 0.1 K/uL (ref 0.0–0.7)
Eosinophils Relative: 1.8 % (ref 0.0–5.0)
HCT: 38.5 % (ref 36.0–46.0)
Hemoglobin: 12.7 g/dL (ref 12.0–15.0)
Lymphocytes Relative: 22.3 % (ref 12.0–46.0)
Lymphs Abs: 1.9 K/uL (ref 0.7–4.0)
MCHC: 32.9 g/dL (ref 30.0–36.0)
MCV: 85.9 fl (ref 78.0–100.0)
Monocytes Absolute: 0.6 K/uL (ref 0.1–1.0)
Monocytes Relative: 7.4 % (ref 3.0–12.0)
Neutro Abs: 5.8 K/uL (ref 1.4–7.7)
Neutrophils Relative %: 68 % (ref 43.0–77.0)
Platelets: 277 K/uL (ref 150.0–400.0)
RBC: 4.48 Mil/uL (ref 3.87–5.11)
RDW: 14.1 % (ref 11.5–15.5)
WBC: 8.5 K/uL (ref 4.0–10.5)

## 2024-09-15 MED ORDER — SENNOSIDES-DOCUSATE SODIUM 8.6-50 MG PO TABS: 2.0000 | ORAL_TABLET | Freq: Two times a day (BID) | ORAL | 5 refills | Status: AC

## 2024-09-15 NOTE — Patient Instructions (Signed)
 Your provider has requested that you go to the basement level for lab work before leaving today. Press B on the elevator. The lab is located at the first door on the left as you exit the elevator.  We have sent the following medications to your pharmacy for you to pick up at your convenience: Sennokot S two tablets twice daily  You have been scheduled for a CT scan of the abdomen and pelvis at [facility], 1st floor Radiology. You are scheduled on 09/14/24 at 2:30 pm. You should arrive 15 minutes prior to your appointment time for registration. If you have any questions regarding your exam or if you need to reschedule, you may call [facility] at [phone] between the hours of 8:00 am and 5:00 pm, Monday-Friday.   Please follow up sooner if symptoms increase or worsen  Due to recent changes in healthcare laws, you may see the results of your imaging and laboratory studies on MyChart before your provider has had a chance to review them.  We understand that in some cases there may be results that are confusing or concerning to you. Not all laboratory results come back in the same time frame and the provider may be waiting for multiple results in order to interpret others.  Please give us  48 hours in order for your provider to thoroughly review all the results before contacting the office for clarification of your results.   Thank you for trusting me with your gastrointestinal care!   Ellouise Console, PA-C _______________________________________________________  If your blood pressure at your visit was 140/90 or greater, please contact your primary care physician to follow up on this.  _______________________________________________________  If you are age 30 or older, your body mass index should be between 23-30. Your Body mass index is 28.37 kg/m. If this is out of the aforementioned range listed, please consider follow up with your Primary Care Provider.  If you are age 12  or younger, your body mass index should be between 19-25. Your Body mass index is 28.37 kg/m. If this is out of the aformentioned range listed, please consider follow up with your Primary Care Provider.   ________________________________________________________  The [facility] providers would like to encourage you to use MYCHART to communicate with providers for non-urgent requests or questions.  Due to long hold times on the telephone, sending your provider a message by [facility] may be a faster and more efficient way to get a response.  Please allow 48 business hours for a response.  Please remember that this is for non-urgent requests.  _______________________________________________________

## 2024-09-16 LAB — EXTRA SPECIMEN

## 2024-09-16 LAB — HEPATITIS B DNA, ULTRAQUANTITATIVE, PCR

## 2024-09-17 NOTE — Progress Notes (Signed)
 Agree with the assessment and plan as outlined by Brigitte Canard, PA-C.

## 2024-09-20 ENCOUNTER — Telehealth: Payer: Self-pay | Admitting: Family Medicine

## 2024-09-20 NOTE — Telephone Encounter (Signed)
 Provider Theotis will not be in office the day appt is scheduled. Please advise and reschedule appt.

## 2024-09-24 ENCOUNTER — Ambulatory Visit (HOSPITAL_COMMUNITY)
Admission: RE | Admit: 2024-09-24 | Discharge: 2024-09-24 | Disposition: A | Source: Ambulatory Visit | Attending: Physician Assistant | Admitting: Physician Assistant

## 2024-09-24 DIAGNOSIS — R1032 Left lower quadrant pain: Secondary | ICD-10-CM | POA: Diagnosis present

## 2024-09-24 DIAGNOSIS — R1031 Right lower quadrant pain: Secondary | ICD-10-CM | POA: Insufficient documentation

## 2024-09-24 LAB — HEPATITIS B DNA, ULTRAQUANTITATIVE, PCR
Hepatitis B DNA: 598 [IU]/mL — ABNORMAL HIGH
Hepatitis B virus DNA: 2.78 {Log_IU}/mL — ABNORMAL HIGH

## 2024-09-24 MED ORDER — IOHEXOL 300 MG/ML  SOLN
100.0000 mL | Freq: Once | INTRAMUSCULAR | Status: AC | PRN
  Administered 2024-09-24: 100 mL via INTRAVENOUS

## 2024-09-24 MED ORDER — IOHEXOL 9 MG/ML PO SOLN
500.0000 mL | ORAL | Status: AC
  Administered 2024-09-24 (×2): 500 mL via ORAL

## 2024-09-29 NOTE — Progress Notes (Signed)
 Patient needs language interpreter: [place]. Notify patient: your abdominal pelvic CT shows: 1.  Diffuse hepatic steatosis.  This is fat stored in your liver.  No liver masses or lesions.  Recommend low-fat diet, regular exercise, weight loss to treat fatty liver disease. 2.  Normal gallbladder with no gallstones.  Normal pancreas, spleen, adrenal glands, kidneys, stomach, and intestines. 3.  No evidence of diverticulitis infection. 4.  Increased stool in the colon consistent with constipation.  At her recent office visit I prescribed Senokot-S 50/8.6 mg.  Please take 2 tablets twice daily every day consistently to treat constipation.  She can also add OTC MiraLAX  if needed.  Drink 64 ounces of water/fluids daily. Ellouise Console, PA-C

## 2024-10-03 NOTE — Progress Notes (Signed)
 Patient needs [place]ese interpreter. Labs showed confirmed known chronic hepatitis B infection. I referred patient back to Dr. Efrain infectious disease specialist. Patient needs to follow-up with infectious disease (not urgent). Ellouise Console, PA-C

## 2024-10-20 ENCOUNTER — Encounter: Admitting: Nurse Practitioner

## 2024-10-20 ENCOUNTER — Encounter: Payer: Medicaid Other | Admitting: Family Medicine

## 2024-10-26 ENCOUNTER — Ambulatory Visit: Admitting: Infectious Diseases

## 2024-10-26 ENCOUNTER — Ambulatory Visit: Admitting: Family Medicine

## 2024-11-02 ENCOUNTER — Ambulatory Visit: Admitting: Nurse Practitioner

## 2024-11-10 ENCOUNTER — Ambulatory Visit: Attending: Nurse Practitioner | Admitting: Nurse Practitioner

## 2024-11-10 ENCOUNTER — Other Ambulatory Visit (HOSPITAL_COMMUNITY)
Admission: RE | Admit: 2024-11-10 | Discharge: 2024-11-10 | Disposition: A | Discharge status: Home / Self Care | Source: Ambulatory Visit | Attending: Nurse Practitioner | Admitting: Nurse Practitioner

## 2024-11-10 ENCOUNTER — Encounter: Payer: Self-pay | Admitting: Nurse Practitioner

## 2024-11-10 VITALS — BP 116/77 | HR 70 | Resp 20 | Ht 61.0 in | Wt 149.0 lb

## 2024-11-10 DIAGNOSIS — N3942 Incontinence without sensory awareness: Secondary | ICD-10-CM | POA: Diagnosis not present

## 2024-11-10 DIAGNOSIS — R7303 Prediabetes: Secondary | ICD-10-CM

## 2024-11-10 DIAGNOSIS — R7989 Other specified abnormal findings of blood chemistry: Secondary | ICD-10-CM

## 2024-11-10 DIAGNOSIS — Z124 Encounter for screening for malignant neoplasm of cervix: Secondary | ICD-10-CM

## 2024-11-10 DIAGNOSIS — E785 Hyperlipidemia, unspecified: Secondary | ICD-10-CM

## 2024-11-10 LAB — POCT GLYCOSYLATED HEMOGLOBIN (HGB A1C): HbA1c, POC (prediabetic range): 6 % (ref 5.7–6.4)

## 2024-11-10 NOTE — Progress Notes (Signed)
 Assessment & Plan:  [name] was seen today for medical management of chronic issues.  Diagnoses and all orders for this visit:  Encounter for Papanicolaou smear for cervical cancer screening -     Cytology - PAP -     Cervicovaginal ancillary only  Prediabetes -     POCT glycosylated hemoglobin (Hb A1C) -     Urine Albumin/Creatinine with ratio (send out) [LAB689] -     CMP14+EGFR  Hyperlipidemia, unspecified hyperlipidemia type -     Lipid panel  Abnormal CBC -     CBC with Differential/Platelet  Urinary incontinence without sensory awareness -     Ambulatory referral to Urogynecology    Patient has been counseled on age-appropriate routine health concerns for screening and prevention. These are reviewed and up-to-date. Referrals have been placed accordingly. Immunizations are up-to-date or declined.    Subjective:   Chief Complaint  Patient presents with   Medical Management of Chronic Issues    [name] 33 y.o. female presents to office today for follow up to prediabetes and requesting a PAP SMEAR  She is a patient of Dr. Newlin.   She has a past medical history of Gestational diabetes and Chronic Hepatitis B infection (Followed by ID).   She experiences urinary incontinence, particularly when sneezing, coughing, or during intercourse. This issue began after her delivery 8 years ago and has become more consistent over time. She describes the fluid during sex as 'more like a urine kind of fluid'. Physical exam did not reveal any prolapse today.   Review of Systems  Constitutional: Negative.  Negative for chills, fever, malaise/fatigue and weight loss.  Respiratory: Negative.  Negative for cough, shortness of breath and wheezing.   Cardiovascular: Negative.  Negative for chest pain, orthopnea and leg swelling.  Gastrointestinal:  Negative for abdominal pain.  Genitourinary:  Negative for flank pain.       SEE HPI  Skin: Negative.  Negative for  rash.  Psychiatric/Behavioral:  Negative for suicidal ideas.     Past Medical History:  Diagnosis Date   Gestational diabetes    Hepatitis B infection    +test in first pregancy    Past Surgical History:  Procedure Laterality Date   NO PAST SURGERIES      Family History  Problem Relation Age of Onset   Breast cancer Sister    Colon cancer Neg Hx    Esophageal cancer Neg Hx    Pancreatic cancer Neg Hx    Stomach cancer Neg Hx     Social History Reviewed with no changes to be made today.   Outpatient Medications Prior to Visit  Medication Sig Dispense Refill   senna-docusate (SENOKOT S) 8.6-50 MG tablet Take 2 tablets by mouth 2 (two) times daily. 120 tablet 5   No facility-administered medications prior to visit.    No Known Allergies     Objective:    BP 116/77 (BP Location: Left Arm, Patient Position: Sitting, Cuff Size: Normal)   Pulse 70   Resp 20   Ht 5' 1 (1.549 m)   Wt 149 lb (67.6 kg)   LMP 10/13/2024 (Approximate)   SpO2 100%   BMI 28.15 kg/m  Wt Readings from Last 3 Encounters:  11/10/24 149 lb (67.6 kg)  09/15/24 150 lb 2 oz (68.1 kg)  07/01/24 149 lb 9.6 oz (67.9 kg)    Physical Exam Exam conducted with a chaperone present.  Constitutional:      Appearance: She is  well-developed.  HENT:     Head: Normocephalic.  Cardiovascular:     Rate and Rhythm: Normal rate and regular rhythm.     Heart sounds: Normal heart sounds.  Pulmonary:     Effort: Pulmonary effort is normal.     Breath sounds: Normal breath sounds.  Abdominal:     General: Bowel sounds are normal.     Palpations: Abdomen is soft.     Hernia: There is no hernia in the left inguinal area.  Genitourinary:    Exam position: Lithotomy position.     Labia:        Right: No rash, tenderness, lesion or injury.        Left: No rash, tenderness, lesion or injury.      Vagina: Normal. No signs of injury and foreign body. No vaginal discharge, erythema, tenderness or bleeding.      Cervix: Normal.     Uterus: Not deviated and not enlarged.      Adnexa:        Right: No mass, tenderness or fullness.         Left: No mass, tenderness or fullness.       Rectum: Normal. No external hemorrhoid.  Lymphadenopathy:     Lower Body: No right inguinal adenopathy. No left inguinal adenopathy.  Skin:    General: Skin is warm and dry.  Neurological:     Mental Status: She is alert and oriented to person, place, and time.  Psychiatric:        Behavior: Behavior normal.        Thought Content: Thought content normal.        Judgment: Judgment normal.          Patient has been counseled extensively about nutrition and exercise as well as the importance of adherence with medications and regular follow-up. The patient was given clear instructions to go to ER or return to medical center if symptoms don't improve, worsen or new problems develop. The patient verbalized understanding.   Follow-up: Return if symptoms worsen or fail to improve.   Haze LELON Servant, FNP-BC [facility] [place], [place] [phone]   11/10/2024, 6:03 PM

## 2024-11-11 ENCOUNTER — Other Ambulatory Visit: Payer: Self-pay

## 2024-11-11 ENCOUNTER — Ambulatory Visit: Admitting: Internal Medicine

## 2024-11-11 ENCOUNTER — Encounter: Payer: Self-pay | Admitting: Internal Medicine

## 2024-11-11 DIAGNOSIS — B181 Chronic viral hepatitis B without delta-agent: Secondary | ICD-10-CM | POA: Diagnosis not present

## 2024-11-11 LAB — CERVICOVAGINAL ANCILLARY ONLY
Bacterial Vaginitis (gardnerella): NEGATIVE
Candida Glabrata: NEGATIVE
Candida Vaginitis: NEGATIVE
Chlamydia: NEGATIVE
Comment: NEGATIVE
Comment: NEGATIVE
Comment: NEGATIVE
Comment: NEGATIVE
Comment: NEGATIVE
Comment: NORMAL
Neisseria Gonorrhea: NEGATIVE
Trichomonas: NEGATIVE

## 2024-11-11 LAB — CBC WITH DIFFERENTIAL/PLATELET
Basophils Absolute: 0 x10E3/uL (ref 0.0–0.2)
Basos: 1 %
EOS (ABSOLUTE): 0.1 x10E3/uL (ref 0.0–0.4)
Eos: 2 %
Hematocrit: 42 % (ref 34.0–46.6)
Hemoglobin: 13.4 g/dL (ref 11.1–15.9)
Immature Grans (Abs): 0 x10E3/uL (ref 0.0–0.1)
Immature Granulocytes: 0 %
Lymphocytes Absolute: 2.1 x10E3/uL (ref 0.7–3.1)
Lymphs: 27 %
MCH: 28.9 pg (ref 26.6–33.0)
MCHC: 31.9 g/dL (ref 31.5–35.7)
MCV: 91 fL (ref 79–97)
Monocytes Absolute: 0.6 x10E3/uL (ref 0.1–0.9)
Monocytes: 8 %
Neutrophils Absolute: 5.1 x10E3/uL (ref 1.4–7.0)
Neutrophils: 62 %
Platelets: 312 x10E3/uL (ref 150–450)
RBC: 4.64 x10E6/uL (ref 3.77–5.28)
RDW: 13 % (ref 11.7–15.4)
WBC: 8.1 x10E3/uL (ref 3.4–10.8)

## 2024-11-11 LAB — CMP14+EGFR
ALT: 13 IU/L (ref 0–32)
AST: 16 IU/L (ref 0–40)
Albumin: 4.7 g/dL (ref 3.9–4.9)
Alkaline Phosphatase: 63 IU/L (ref 41–116)
BUN/Creatinine Ratio: 17 (ref 9–23)
BUN: 10 mg/dL (ref 6–20)
Bilirubin Total: 0.4 mg/dL (ref 0.0–1.2)
CO2: 22 mmol/L (ref 20–29)
Calcium: 9.3 mg/dL (ref 8.7–10.2)
Chloride: 101 mmol/L (ref 96–106)
Creatinine, Ser: 0.6 mg/dL (ref 0.57–1.00)
Globulin, Total: 3.6 g/dL (ref 1.5–4.5)
Glucose: 98 mg/dL (ref 70–99)
Potassium: 3.7 mmol/L (ref 3.5–5.2)
Sodium: 137 mmol/L (ref 134–144)
Total Protein: 8.3 g/dL (ref 6.0–8.5)
eGFR: 123 mL/min/1.73 (ref >59)

## 2024-11-11 LAB — LIPID PANEL
Chol/HDL Ratio: 4 ratio (ref 0.0–4.4)
Cholesterol, Total: 183 mg/dL (ref 100–199)
HDL: 46 mg/dL (ref >39)
LDL Chol Calc (NIH): 115 mg/dL — ABNORMAL HIGH (ref 0–99)
Triglycerides: 120 mg/dL (ref 0–149)
VLDL Cholesterol Cal: 22 mg/dL (ref 5–40)

## 2024-11-11 LAB — MICROALBUMIN / CREATININE URINE RATIO
Creatinine, Urine: 79.4 mg/dL
Microalb/Creat Ratio: 4 mg/g{creat} (ref 0–29)
Microalbumin, Urine: 3.5 ug/mL

## 2024-11-11 NOTE — Progress Notes (Unsigned)
     Patient: [name]  DOB: Mar 10, 1991 MRN: [id number] PCP: Delbert Clam, MD  Referring Provider: ***  Chief Complaint  Patient presents with   Follow-up    Hep b     Patient Active Problem List   Diagnosis Date Noted   History of gestational diabetes 01/20/2017   Hepatitis B, chronic (HCC) 03/13/2016   Language barrier, cultural differences 03/13/2016     Subjective:  [name] is a 33 y.o. M Here for follow up of chronic hepatitis B. Previosly seen by Dr. Efrain in 12/22/2018 At last visit noted that seen r last month and I did labs and viral DNA has remained low, normal LFTs.  She is hepatitis B E. Antibody Positive and E. Antigen Negative.  She is hepatitis C negative.  She reported no new issues.    ROS  Past Medical History:  Diagnosis Date   Gestational diabetes    Hepatitis B infection    +test in first pregancy    Outpatient Medications Prior to Visit  Medication Sig Dispense Refill   senna-docusate (SENOKOT S) 8.6-50 MG tablet Take 2 tablets by mouth 2 (two) times daily. 120 tablet 5   No facility-administered medications prior to visit.     No Known Allergies  Social History   Tobacco Use   Smoking status: Never   Smokeless tobacco: Never  Vaping Use   Vaping status: Never Used  Substance Use Topics   Alcohol use: No   Drug use: No    Family History  Problem Relation Age of Onset   Breast cancer Sister    Colon cancer Neg Hx    Esophageal cancer Neg Hx    Pancreatic cancer Neg Hx    Stomach cancer Neg Hx     Objective:   Vitals:   11/11/24 1610  BP: 118/79  Pulse: 74  Temp: 98.5 F (36.9 C)  TempSrc: Oral  SpO2: 99%  Weight: 145 lb (65.8 kg)  Height: 5' 1 (1.549 m)   Body mass index is 27.4 kg/m.  Physical Exam  Lab Results: Lab Results  Component Value Date   WBC 8.1 11/10/2024   HGB 13.4 11/10/2024   HCT 42.0 11/10/2024   MCV 91 11/10/2024   PLT 312 11/10/2024    Lab Results   Component Value Date   CREATININE 0.60 11/10/2024   BUN 10 11/10/2024   NA 137 11/10/2024   K 3.7 11/10/2024   CL 101 11/10/2024   CO2 22 11/10/2024    Lab Results  Component Value Date   ALT 13 11/10/2024   AST 16 11/10/2024   ALKPHOS 63 11/10/2024   BILITOT 0.4 11/10/2024     Assessment & Plan:  #chronic HBV -HBV DNA 598 on 1014/25, normal lfts -2018  US  with F0/F1 score, minimal fibrosis, no f/u required -Plan -HBV dna remains low, lfts normal no plan to start meds for hbv -F.U in one year  Loney Stank, MD [facility]   11/11/24  4:28 PM

## 2024-11-14 ENCOUNTER — Ambulatory Visit: Payer: Self-pay | Admitting: Nurse Practitioner

## 2024-11-15 LAB — CYTOLOGY - PAP
Comment: NEGATIVE
Diagnosis: NEGATIVE
Diagnosis: REACTIVE
High risk HPV: NEGATIVE

## 2025-11-22 ENCOUNTER — Ambulatory Visit: Admitting: Internal Medicine
